# Patient Record
Sex: Male | Born: 1960 | Race: White | Hispanic: No | Marital: Single | Smoking: Current every day smoker
Health system: Southern US, Community
[De-identification: ages and names within clinical notes are randomized; demographics above are authoritative.]

## PROBLEM LIST (undated history)

## (undated) DIAGNOSIS — F209 Schizophrenia, unspecified: Secondary | ICD-10-CM

## (undated) DIAGNOSIS — I4891 Unspecified atrial fibrillation: Secondary | ICD-10-CM

## (undated) DIAGNOSIS — M5137 Other intervertebral disc degeneration, lumbosacral region: Secondary | ICD-10-CM

## (undated) DIAGNOSIS — J449 Chronic obstructive pulmonary disease, unspecified: Secondary | ICD-10-CM

## (undated) DIAGNOSIS — E669 Obesity, unspecified: Secondary | ICD-10-CM

## (undated) DIAGNOSIS — N183 Chronic kidney disease, stage 3 unspecified: Secondary | ICD-10-CM

## (undated) DIAGNOSIS — F1910 Other psychoactive substance abuse, uncomplicated: Secondary | ICD-10-CM

## (undated) DIAGNOSIS — I10 Essential (primary) hypertension: Secondary | ICD-10-CM

## (undated) DIAGNOSIS — I509 Heart failure, unspecified: Secondary | ICD-10-CM

## (undated) DIAGNOSIS — M51379 Other intervertebral disc degeneration, lumbosacral region without mention of lumbar back pain or lower extremity pain: Secondary | ICD-10-CM

## (undated) DIAGNOSIS — F418 Other specified anxiety disorders: Secondary | ICD-10-CM

## (undated) DIAGNOSIS — G894 Chronic pain syndrome: Secondary | ICD-10-CM

## (undated) DIAGNOSIS — F319 Bipolar disorder, unspecified: Secondary | ICD-10-CM

## (undated) DIAGNOSIS — G43909 Migraine, unspecified, not intractable, without status migrainosus: Secondary | ICD-10-CM

## (undated) DIAGNOSIS — K219 Gastro-esophageal reflux disease without esophagitis: Secondary | ICD-10-CM

## (undated) DIAGNOSIS — K589 Irritable bowel syndrome without diarrhea: Secondary | ICD-10-CM

## (undated) DIAGNOSIS — K861 Other chronic pancreatitis: Secondary | ICD-10-CM

## (undated) DIAGNOSIS — G4733 Obstructive sleep apnea (adult) (pediatric): Secondary | ICD-10-CM

## (undated) HISTORY — PX: PROSTATE SURGERY: SHX751

## (undated) HISTORY — DX: Obstructive sleep apnea (adult) (pediatric): G47.33

## (undated) HISTORY — PX: CHOLECYSTECTOMY: SHX55

## (undated) HISTORY — DX: Chronic obstructive pulmonary disease, unspecified: J44.9

## (undated) HISTORY — DX: Essential (primary) hypertension: I10

## (undated) HISTORY — DX: Heart failure, unspecified: I50.9

## (undated) HISTORY — DX: Unspecified atrial fibrillation: I48.91

## (undated) HISTORY — DX: Obesity, unspecified: E66.9

## (undated) HISTORY — DX: Other intervertebral disc degeneration, lumbosacral region: M51.37

## (undated) HISTORY — PX: APPENDECTOMY: SHX54

## (undated) HISTORY — DX: Chronic pain syndrome: G89.4

## (undated) HISTORY — DX: Other intervertebral disc degeneration, lumbosacral region without mention of lumbar back pain or lower extremity pain: M51.379

---

## 2012-05-01 ENCOUNTER — Encounter (HOSPITAL_BASED_OUTPATIENT_CLINIC_OR_DEPARTMENT_OTHER): Payer: Self-pay | Admitting: *Deleted

## 2012-05-01 ENCOUNTER — Emergency Department (HOSPITAL_BASED_OUTPATIENT_CLINIC_OR_DEPARTMENT_OTHER)
Admission: EM | Admit: 2012-05-01 | Discharge: 2012-05-01 | Disposition: A | Discharge status: Home / Self Care | Payer: Medicare HMO | Attending: Emergency Medicine | Admitting: Emergency Medicine

## 2012-05-01 DIAGNOSIS — F17200 Nicotine dependence, unspecified, uncomplicated: Secondary | ICD-10-CM | POA: Insufficient documentation

## 2012-05-01 DIAGNOSIS — R110 Nausea: Secondary | ICD-10-CM | POA: Insufficient documentation

## 2012-05-01 DIAGNOSIS — Z8719 Personal history of other diseases of the digestive system: Secondary | ICD-10-CM | POA: Insufficient documentation

## 2012-05-01 DIAGNOSIS — Z8659 Personal history of other mental and behavioral disorders: Secondary | ICD-10-CM | POA: Insufficient documentation

## 2012-05-01 DIAGNOSIS — G43909 Migraine, unspecified, not intractable, without status migrainosus: Secondary | ICD-10-CM | POA: Insufficient documentation

## 2012-05-01 HISTORY — DX: Schizophrenia, unspecified: F20.9

## 2012-05-01 HISTORY — DX: Bipolar disorder, unspecified: F31.9

## 2012-05-01 HISTORY — DX: Irritable bowel syndrome, unspecified: K58.9

## 2012-05-01 HISTORY — DX: Gastro-esophageal reflux disease without esophagitis: K21.9

## 2012-05-01 HISTORY — DX: Migraine, unspecified, not intractable, without status migrainosus: G43.909

## 2012-05-01 MED ORDER — HYDROMORPHONE HCL PF 1 MG/ML IJ SOLN
1.0000 mg | Freq: Once | INTRAMUSCULAR | Status: AC
  Administered 2012-05-01: 1 mg via INTRAVENOUS
  Filled 2012-05-01: qty 1

## 2012-05-01 MED ORDER — SODIUM CHLORIDE 0.9 % IV SOLN
Freq: Once | INTRAVENOUS | Status: AC
  Administered 2012-05-01: 22:00:00 via INTRAVENOUS

## 2012-05-01 MED ORDER — METOCLOPRAMIDE HCL 5 MG/ML IJ SOLN
10.0000 mg | Freq: Once | INTRAMUSCULAR | Status: AC
  Administered 2012-05-01: 10 mg via INTRAVENOUS
  Filled 2012-05-01: qty 2

## 2012-05-01 NOTE — ED Provider Notes (Signed)
History   Scribed for Celene Kras, MD, the patient was seen in room MH07/MH07 . This chart was scribed by Lewanda Rife.   CSN: [id number]  Arrival date & time 05/01/12  1642   First MD Initiated Contact with Patient 05/01/12 2127      Chief Complaint  Patient presents with  . Migraine    (Consider location/radiation/quality/duration/timing/severity/associated sxs/prior treatment) HPI  FLAVIO LINDROTH is a 52 y.o. male who presents to the Emergency Department complaining of a constant and worsening migraine onset 3 days. Pt reports migraine's progression as gradual. Pt reports nausea, photophobia and mild neck pain. Pt denies emesis, and fever. Pt states migraine is located behind both eyes. Pt states he has a hx of migraines and takes Imitrex to treat symptoms, but reports having no relief since onset.   Past Medical History  Diagnosis Date  . Migraines   . IBS (irritable bowel syndrome)   . Schizophrenia   . Bipolar affective disorder   . Acid reflux     Past Surgical History  Procedure Date  . Appendectomy   . Cholecystectomy     History reviewed. No pertinent family history.  History  Substance Use Topics  . Smoking status: Current Every Day Smoker  . Smokeless tobacco: Not on file  . Alcohol Use: No      Review of Systems  Constitutional: Negative.  Negative for fever.  HENT: Positive for neck pain. Negative for neck stiffness.   Respiratory: Negative.   Cardiovascular: Negative.   Gastrointestinal: Positive for nausea. Negative for vomiting.  Skin: Negative.   Neurological: Positive for headaches.  Hematological: Negative.   Psychiatric/Behavioral: Negative.   All other systems reviewed and are negative.    Allergies  Erythromycin; Penicillins; Darvocet; and Naprosyn  Home Medications  No current outpatient prescriptions on file.  BP 123/73  Pulse 79  Temp 97.8 F (36.6 C) (Oral)  Resp 16  Ht 5\' 8"  (1.727 m)  Wt 193 lb (87.544  kg)  BMI 29.35 kg/m2  SpO2 98%  Physical Exam  Nursing note and vitals reviewed. Constitutional: He appears well-developed and well-nourished. No distress.  HENT:  Head: Normocephalic and atraumatic.  Right Ear: External ear normal.  Left Ear: External ear normal.  Eyes: Conjunctivae normal are normal. Right eye exhibits no discharge. Left eye exhibits no discharge. No scleral icterus.  Neck: Normal range of motion and full passive range of motion without pain. Neck supple. No rigidity. No tracheal deviation present.  Cardiovascular: Normal rate, regular rhythm and intact distal pulses.   Pulmonary/Chest: Effort normal and breath sounds normal. No stridor. No respiratory distress. He has no wheezes. He has no rales.  Abdominal: Soft. Bowel sounds are normal. He exhibits no distension. There is no tenderness. There is no rebound and no guarding.  Musculoskeletal: He exhibits no edema and no tenderness.       Cervical paraspinal tenderness No signs of meningismus    Neurological: He is alert. He has normal strength. No sensory deficit. Cranial nerve deficit:  no gross defecits noted. He exhibits normal muscle tone. He displays no seizure activity. Coordination normal.  Skin: Skin is warm and dry. No rash noted.  Psychiatric: He has a normal mood and affect.    ED Course  Procedures (including critical care time) Medications administered in the ED 05/01/12 2200   0.9 % sodium chloride infusion Once    Route: Intravenous      Last MAR action: New Bag/Given Kazimierz Springborn R  05/01/12 2145   HYDROmorphone (DILAUDID) injection 1 mg Once    Route: Intravenous Ordered Dose: 1 mg      Last MAR action: Given Darlena Koval R 05/01/12 2145   metoCLOPramide (REGLAN) injection 10 mg Once    Route: Intravenous Ordered Dose: 10 mg       Labs Reviewed - No data to display No results found.   1. Migraine headache       MDM  The patient improved after treatment in emergency department. He  felt to go home and was ready for discharge  Symptoms are consistent with a migraine headache. I doubt meningitis, subarachnoid hemorrhage or other emergency medical condition     I personally performed the services described in this documentation, which was scribed in my presence. The recorded information has been reviewed and is accurate.    Celene Kras, MD 05/01/12 2218

## 2012-05-01 NOTE — ED Notes (Signed)
D/c back to [facility] with transport by [facility]- no new rx

## 2012-05-01 NOTE — ED Notes (Signed)
[facility] called for transport 

## 2012-05-01 NOTE — ED Notes (Signed)
Pt states he feels better and is ready for d/c- EDP [facility] notified- ride contacted

## 2012-05-01 NOTE — ED Notes (Signed)
Migraine since last Thursday. Neck and face hurting. PERL.

## 2012-05-01 NOTE — ED Notes (Signed)
MD at bedside. 

## 2012-05-03 ENCOUNTER — Emergency Department (HOSPITAL_BASED_OUTPATIENT_CLINIC_OR_DEPARTMENT_OTHER)
Admission: EM | Admit: 2012-05-03 | Discharge: 2012-05-03 | Disposition: A | Discharge status: Rehabilitation Facility | Payer: Medicare HMO | Attending: Emergency Medicine | Admitting: Emergency Medicine

## 2012-05-03 ENCOUNTER — Encounter (HOSPITAL_BASED_OUTPATIENT_CLINIC_OR_DEPARTMENT_OTHER): Payer: Self-pay | Admitting: Emergency Medicine

## 2012-05-03 ENCOUNTER — Emergency Department (HOSPITAL_BASED_OUTPATIENT_CLINIC_OR_DEPARTMENT_OTHER): Payer: Medicare HMO

## 2012-05-03 DIAGNOSIS — F17200 Nicotine dependence, unspecified, uncomplicated: Secondary | ICD-10-CM | POA: Insufficient documentation

## 2012-05-03 DIAGNOSIS — Z79899 Other long term (current) drug therapy: Secondary | ICD-10-CM | POA: Insufficient documentation

## 2012-05-03 DIAGNOSIS — R05 Cough: Secondary | ICD-10-CM | POA: Insufficient documentation

## 2012-05-03 DIAGNOSIS — F319 Bipolar disorder, unspecified: Secondary | ICD-10-CM | POA: Insufficient documentation

## 2012-05-03 DIAGNOSIS — R509 Fever, unspecified: Secondary | ICD-10-CM | POA: Insufficient documentation

## 2012-05-03 DIAGNOSIS — F209 Schizophrenia, unspecified: Secondary | ICD-10-CM | POA: Insufficient documentation

## 2012-05-03 DIAGNOSIS — J111 Influenza due to unidentified influenza virus with other respiratory manifestations: Secondary | ICD-10-CM

## 2012-05-03 DIAGNOSIS — Z8719 Personal history of other diseases of the digestive system: Secondary | ICD-10-CM | POA: Insufficient documentation

## 2012-05-03 DIAGNOSIS — IMO0001 Reserved for inherently not codable concepts without codable children: Secondary | ICD-10-CM | POA: Insufficient documentation

## 2012-05-03 DIAGNOSIS — K219 Gastro-esophageal reflux disease without esophagitis: Secondary | ICD-10-CM | POA: Insufficient documentation

## 2012-05-03 DIAGNOSIS — R059 Cough, unspecified: Secondary | ICD-10-CM | POA: Insufficient documentation

## 2012-05-03 DIAGNOSIS — Z8679 Personal history of other diseases of the circulatory system: Secondary | ICD-10-CM | POA: Insufficient documentation

## 2012-05-03 MED ORDER — OSELTAMIVIR PHOSPHATE 75 MG PO CAPS
75.0000 mg | ORAL_CAPSULE | Freq: Once | ORAL | Status: AC
  Administered 2012-05-03: 75 mg via ORAL
  Filled 2012-05-03: qty 1

## 2012-05-03 MED ORDER — OSELTAMIVIR PHOSPHATE 75 MG PO CAPS: 75.0000 mg | ORAL_CAPSULE | Freq: Two times a day (BID) | ORAL | Status: DC

## 2012-05-03 NOTE — ED Notes (Signed)
Pt. residing at inpt. treatment at [facility].  Several other residents have similar sx.

## 2012-05-03 NOTE — ED Notes (Signed)
Cough, sore throat, chills, fever, body aches, nausea since 05/01/12.  Seen here for same on 1/18, but states sx. worsened after he was discharged home.

## 2012-05-03 NOTE — ED Provider Notes (Signed)
History     CSN: [id number]  Arrival date & time 05/03/12  1245   First MD Initiated Contact with Patient 05/03/12 1300      Chief Complaint  Patient presents with  . Cough  . Fever    (Consider location/radiation/quality/duration/timing/severity/associated sxs/prior treatment) HPI Comments: PT comes in with cc of flu. Pt started having cough, myalgias, fevers, chills on Saturday night. The cough is occasionally productive. Pt is at [facility], where several other residents have viral syndrome or flu.  Patient is a 52 y.o. male presenting with cough and fever. The history is provided by the patient.  Cough Associated symptoms include myalgias. Pertinent negatives include no chest pain and no shortness of breath.  Fever Primary symptoms of the febrile illness include fever, cough and myalgias. Primary symptoms do not include shortness of breath, abdominal pain or dysuria.    Past Medical History  Diagnosis Date  . Migraines   . IBS (irritable bowel syndrome)   . Schizophrenia   . Bipolar affective disorder   . Acid reflux     Past Surgical History  Procedure Date  . Appendectomy   . Cholecystectomy     No family history on file.  History  Substance Use Topics  . Smoking status: Current Every Day Smoker  . Smokeless tobacco: Not on file  . Alcohol Use: No      Review of Systems  Constitutional: Positive for fever. Negative for activity change and appetite change.  Respiratory: Positive for cough. Negative for shortness of breath.   Cardiovascular: Negative for chest pain.  Gastrointestinal: Negative for abdominal pain.  Genitourinary: Negative for dysuria.  Musculoskeletal: Positive for myalgias and back pain.  Hematological: Does not bruise/bleed easily.    Allergies  Erythromycin; Penicillins; Darvocet; and Naprosyn  Home Medications   Current Outpatient Rx  Name  Route  Sig  Dispense  Refill  . MIRTAZAPINE 30 MG PO TABS   Oral   Take 30 mg  by mouth at bedtime.         . OMEPRAZOLE 40 MG PO CPDR   Oral   Take 40 mg by mouth daily.         . SUMATRIPTAN SUCCINATE 50 MG PO TABS   Oral   Take 50 mg by mouth 2 (two) times daily as needed.           BP 149/96  Pulse 79  Temp 98.4 F (36.9 C) (Oral)  Resp 20  Ht 5\' 7"  (1.702 m)  Wt 193 lb (87.544 kg)  BMI 30.23 kg/m2  SpO2 97%  Physical Exam  Nursing note and vitals reviewed. Constitutional: He is oriented to person, place, and time. He appears well-developed.  HENT:  Head: Normocephalic and atraumatic.  Eyes: Conjunctivae normal and EOM are normal. Pupils are equal, round, and reactive to light.  Neck: Normal range of motion. Neck supple.  Cardiovascular: Normal rate and regular rhythm.   Pulmonary/Chest: Effort normal and breath sounds normal. No respiratory distress. He has no wheezes. He has no rales.  Abdominal: Soft. Bowel sounds are normal. He exhibits no distension. There is no tenderness. There is no rebound and no guarding.  Neurological: He is alert and oriented to person, place, and time.  Skin: Skin is warm.    ED Course  Procedures (including critical care time)  Labs Reviewed - No data to display Dg Chest 2 View  05/03/2012  *RADIOLOGY REPORT*  Clinical Data: Chest pain.  Cough.  Fever.  CHEST - 2 VIEW  Comparison: None.  Findings: Heart size is normal.  No evidence of pulmonary infiltrate or edema.  No evidence of pleural effusion.  Mild left lower lobe scarring noted.  No mass or lymphadenopathy identified.  IMPRESSION: No active disease.   Original Report Authenticated By: Myles Rosenthal, M.D.      No diagnosis found.    MDM  DDX includes: Viral syndrome Influenza   Pt comes in with cc of URI like sx. He has myalgias, chills, cough. Vitals show slight tachypnea - RR 18-20 in the ED, otherwise no tachycardia, no fevers. He has some co morbidities, notably CKD - but there is no dehydration noted. No indication for admission. Will  give tamiflu as he is within the window and living at a facility where there are other people who can get infected from him.  Derwood Kaplan, MD 05/03/12 1527

## 2012-05-12 ENCOUNTER — Encounter (HOSPITAL_BASED_OUTPATIENT_CLINIC_OR_DEPARTMENT_OTHER): Payer: Self-pay | Admitting: *Deleted

## 2012-05-12 ENCOUNTER — Emergency Department (HOSPITAL_BASED_OUTPATIENT_CLINIC_OR_DEPARTMENT_OTHER)
Admission: EM | Admit: 2012-05-12 | Discharge: 2012-05-12 | Disposition: A | Discharge status: Home / Self Care | Payer: Medicare HMO | Attending: Emergency Medicine | Admitting: Emergency Medicine

## 2012-05-12 ENCOUNTER — Emergency Department (HOSPITAL_BASED_OUTPATIENT_CLINIC_OR_DEPARTMENT_OTHER): Payer: Medicare HMO

## 2012-05-12 DIAGNOSIS — R1084 Generalized abdominal pain: Secondary | ICD-10-CM | POA: Insufficient documentation

## 2012-05-12 DIAGNOSIS — R109 Unspecified abdominal pain: Secondary | ICD-10-CM

## 2012-05-12 DIAGNOSIS — N183 Chronic kidney disease, stage 3 unspecified: Secondary | ICD-10-CM | POA: Insufficient documentation

## 2012-05-12 DIAGNOSIS — Z8719 Personal history of other diseases of the digestive system: Secondary | ICD-10-CM | POA: Insufficient documentation

## 2012-05-12 DIAGNOSIS — F17200 Nicotine dependence, unspecified, uncomplicated: Secondary | ICD-10-CM | POA: Insufficient documentation

## 2012-05-12 DIAGNOSIS — F341 Dysthymic disorder: Secondary | ICD-10-CM | POA: Insufficient documentation

## 2012-05-12 DIAGNOSIS — Z8679 Personal history of other diseases of the circulatory system: Secondary | ICD-10-CM | POA: Insufficient documentation

## 2012-05-12 DIAGNOSIS — F319 Bipolar disorder, unspecified: Secondary | ICD-10-CM | POA: Insufficient documentation

## 2012-05-12 DIAGNOSIS — F209 Schizophrenia, unspecified: Secondary | ICD-10-CM | POA: Insufficient documentation

## 2012-05-12 DIAGNOSIS — F1410 Cocaine abuse, uncomplicated: Secondary | ICD-10-CM | POA: Insufficient documentation

## 2012-05-12 DIAGNOSIS — Z79899 Other long term (current) drug therapy: Secondary | ICD-10-CM | POA: Insufficient documentation

## 2012-05-12 HISTORY — DX: Other chronic pancreatitis: K86.1

## 2012-05-12 HISTORY — DX: Chronic kidney disease, stage 3 unspecified: N18.30

## 2012-05-12 HISTORY — DX: Other specified anxiety disorders: F41.8

## 2012-05-12 HISTORY — DX: Chronic kidney disease, stage 3 (moderate): N18.3

## 2012-05-12 HISTORY — DX: Other psychoactive substance abuse, uncomplicated: F19.10

## 2012-05-12 LAB — COMPREHENSIVE METABOLIC PANEL
AST: 13 U/L (ref 0–37)
Albumin: 3.7 g/dL (ref 3.5–5.2)
Alkaline Phosphatase: 66 U/L (ref 39–117)
Chloride: 106 mEq/L (ref 96–112)
Creatinine, Ser: 1.1 mg/dL (ref 0.50–1.35)
Potassium: 4.6 mEq/L (ref 3.5–5.1)
Total Bilirubin: 0.3 mg/dL (ref 0.3–1.2)
Total Protein: 6.5 g/dL (ref 6.0–8.3)

## 2012-05-12 LAB — URINALYSIS, ROUTINE W REFLEX MICROSCOPIC
Ketones, ur: NEGATIVE mg/dL
Leukocytes, UA: NEGATIVE
Nitrite: NEGATIVE
Protein, ur: NEGATIVE mg/dL
Urobilinogen, UA: 0.2 mg/dL (ref 0.0–1.0)

## 2012-05-12 LAB — CBC WITH DIFFERENTIAL/PLATELET
Basophils Absolute: 0 10*3/uL (ref 0.0–0.1)
Basophils Relative: 0 % (ref 0–1)
Eosinophils Absolute: 0.2 10*3/uL (ref 0.0–0.7)
MCHC: 35 g/dL (ref 30.0–36.0)
Neutro Abs: 3.5 10*3/uL (ref 1.7–7.7)
Neutrophils Relative %: 51 % (ref 43–77)
RDW: 12.8 % (ref 11.5–15.5)

## 2012-05-12 MED ORDER — SODIUM CHLORIDE 0.9 % IV BOLUS (SEPSIS)
1000.0000 mL | Freq: Once | INTRAVENOUS | Status: AC
  Administered 2012-05-12: 1000 mL via INTRAVENOUS

## 2012-05-12 MED ORDER — ONDANSETRON HCL 4 MG/2ML IJ SOLN
4.0000 mg | Freq: Once | INTRAMUSCULAR | Status: AC
  Administered 2012-05-12: 4 mg via INTRAVENOUS
  Filled 2012-05-12: qty 2

## 2012-05-12 NOTE — ED Provider Notes (Signed)
History     CSN: [id number]  Arrival date & time 05/12/12  0940   First MD Initiated Contact with Patient 05/12/12 [phone]      Chief Complaint  Patient presents with  . Abdominal Pain    (Consider location/radiation/quality/duration/timing/severity/associated sxs/prior treatment) HPI Comments: Patient here from [facility] where he is receiving treatment for cocaine addiction.  Presents with generalized abd pain, distension that has worsened over the past week.  He has a history of similar episodes for which he reports multiple colonoscpopy have been performed.  No cause has been found.  He does have a history of pancreatitis and has had his gallbladder and appendix removed in the past.  No fevers or chills.  No urinary complaints.   Patient is a 52 y.o. male presenting with abdominal pain. The history is provided by the patient.  Abdominal Pain The primary symptoms of the illness include abdominal pain and nausea. The primary symptoms of the illness do not include fever, vomiting, diarrhea or dysuria. Episode onset: one week ago. The onset of the illness was gradual. The problem has been gradually worsening.  The patient has not had a change in bowel habit. Symptoms associated with the illness do not include chills, constipation, hematuria, frequency or back pain.    Past Medical History  Diagnosis Date  . Migraines   . IBS (irritable bowel syndrome)   . Schizophrenia   . Bipolar affective disorder   . Acid reflux   . Substance abuse   . Chronic kidney disease (CKD), stage III (moderate)   . Depression with anxiety   . Pancreatitis, chronic     Past Surgical History  Procedure Date  . Appendectomy   . Cholecystectomy     No family history on file.  History  Substance Use Topics  . Smoking status: Current Every Day Smoker  . Smokeless tobacco: Not on file  . Alcohol Use: No      Review of Systems  Constitutional: Negative for fever and chills.  Gastrointestinal:  Positive for nausea and abdominal pain. Negative for vomiting, diarrhea and constipation.  Genitourinary: Negative for dysuria, frequency and hematuria.  Musculoskeletal: Negative for back pain.  All other systems reviewed and are negative.    Allergies  Erythromycin; Penicillins; Darvocet; and Naprosyn  Home Medications   Current Outpatient Rx  Name  Route  Sig  Dispense  Refill  . HYDROCORTISONE 2.5 % RE CREA   Rectal   Place rectally 2 (two) times daily.         Marland Kitchen PERPHENAZINE 4 MG PO TABS   Oral   Take 4 mg by mouth 2 (two) times daily.         Marland Kitchen MIRTAZAPINE 30 MG PO TABS   Oral   Take 30 mg by mouth at bedtime.         . OMEPRAZOLE 40 MG PO CPDR   Oral   Take 40 mg by mouth daily.         . OSELTAMIVIR PHOSPHATE 75 MG PO CAPS   Oral   Take 1 capsule (75 mg total) by mouth every 12 (twelve) hours.   13 capsule   0   . SUMATRIPTAN SUCCINATE 50 MG PO TABS   Oral   Take 50 mg by mouth 2 (two) times daily as needed.           BP 128/79  Pulse 75  Temp 98.1 F (36.7 C) (Oral)  Resp 18  SpO2 100%  Physical  Exam  Nursing note and vitals reviewed. Constitutional: He is oriented to person, place, and time. He appears well-developed and well-nourished. No distress.  HENT:  Head: Normocephalic and atraumatic.  Mouth/Throat: Oropharynx is clear and moist.  Neck: Normal range of motion. Neck supple.  Cardiovascular: Normal rate and regular rhythm.   Pulmonary/Chest: Effort normal and breath sounds normal.  Abdominal: Soft. Bowel sounds are normal. He exhibits no distension.       There is ttp across the mid abdomen with no rebound or guarding.    Musculoskeletal: Normal range of motion. He exhibits no edema.  Neurological: He is alert and oriented to person, place, and time.  Skin: Skin is warm and dry. He is not diaphoretic.    ED Course  Procedures (including critical care time)   Labs Reviewed  URINALYSIS, ROUTINE W REFLEX MICROSCOPIC  CBC  WITH DIFFERENTIAL  COMPREHENSIVE METABOLIC PANEL  LIPASE, BLOOD   No results found.   No diagnosis found.    MDM  The patient presents with abd pain and distention, but I cannot find a reason for this.  The labs and ct are reassuring.  He has a long history of similar episodes and he needs to follow up with the GI doctor who was treated him in the past.  As he is at [facility], he cannot receive pain medications.  He is to take tylenol or motrin.        Geoffery Lyons, MD 05/12/12 [phone]

## 2012-05-12 NOTE — ED Notes (Signed)
Patient states he has generalized abdominal pain for one week.  States he c/o tightness in his mid abdomen and the pain has worsened.  Denies nausea, vomiting or diarrhea.  States he is having normal bowel movements.

## 2018-07-16 ENCOUNTER — Other Ambulatory Visit: Payer: Self-pay

## 2018-07-16 ENCOUNTER — Emergency Department (HOSPITAL_COMMUNITY)
Admission: EM | Admit: 2018-07-16 | Discharge: 2018-07-19 | Disposition: A | Discharge status: Home / Self Care | Payer: Medicare Other | Attending: Emergency Medicine | Admitting: Emergency Medicine

## 2018-07-16 ENCOUNTER — Emergency Department (HOSPITAL_COMMUNITY): Payer: Medicare Other

## 2018-07-16 DIAGNOSIS — F17200 Nicotine dependence, unspecified, uncomplicated: Secondary | ICD-10-CM | POA: Diagnosis not present

## 2018-07-16 DIAGNOSIS — R4689 Other symptoms and signs involving appearance and behavior: Secondary | ICD-10-CM

## 2018-07-16 DIAGNOSIS — R45851 Suicidal ideations: Secondary | ICD-10-CM | POA: Insufficient documentation

## 2018-07-16 DIAGNOSIS — R197 Diarrhea, unspecified: Secondary | ICD-10-CM | POA: Insufficient documentation

## 2018-07-16 DIAGNOSIS — R4589 Other symptoms and signs involving emotional state: Secondary | ICD-10-CM

## 2018-07-16 DIAGNOSIS — R109 Unspecified abdominal pain: Secondary | ICD-10-CM | POA: Diagnosis not present

## 2018-07-16 DIAGNOSIS — R443 Hallucinations, unspecified: Secondary | ICD-10-CM

## 2018-07-16 DIAGNOSIS — F250 Schizoaffective disorder, bipolar type: Secondary | ICD-10-CM | POA: Diagnosis not present

## 2018-07-16 DIAGNOSIS — R0789 Other chest pain: Secondary | ICD-10-CM | POA: Insufficient documentation

## 2018-07-16 DIAGNOSIS — R079 Chest pain, unspecified: Secondary | ICD-10-CM

## 2018-07-16 DIAGNOSIS — Z79899 Other long term (current) drug therapy: Secondary | ICD-10-CM | POA: Insufficient documentation

## 2018-07-16 DIAGNOSIS — R45850 Homicidal ideations: Secondary | ICD-10-CM

## 2018-07-16 DIAGNOSIS — N183 Chronic kidney disease, stage 3 (moderate): Secondary | ICD-10-CM | POA: Diagnosis not present

## 2018-07-16 LAB — COMPREHENSIVE METABOLIC PANEL
ALT: 19 U/L (ref 0–44)
AST: 16 U/L (ref 15–41)
Albumin: 3.3 g/dL — ABNORMAL LOW (ref 3.5–5.0)
Alkaline Phosphatase: 56 U/L (ref 38–126)
Anion gap: 8 (ref 5–15)
BUN: 25 mg/dL — ABNORMAL HIGH (ref 6–20)
CO2: 27 mmol/L (ref 22–32)
Calcium: 8.8 mg/dL — ABNORMAL LOW (ref 8.9–10.3)
Chloride: 103 mmol/L (ref 98–111)
Creatinine, Ser: 1.36 mg/dL — ABNORMAL HIGH (ref 0.61–1.24)
GFR calc Af Amer: >60 mL/min (ref >60)
GFR calc non Af Amer: 58 mL/min — ABNORMAL LOW (ref >60)
Glucose, Bld: 141 mg/dL — ABNORMAL HIGH (ref 70–99)
Potassium: 4.1 mmol/L (ref 3.5–5.1)
Sodium: 138 mmol/L (ref 135–145)
Total Bilirubin: 0.4 mg/dL (ref 0.3–1.2)
Total Protein: 5.8 g/dL — ABNORMAL LOW (ref 6.5–8.1)

## 2018-07-16 LAB — URINALYSIS, ROUTINE W REFLEX MICROSCOPIC
Bilirubin Urine: NEGATIVE
Glucose, UA: NEGATIVE mg/dL
Hgb urine dipstick: NEGATIVE
Ketones, ur: NEGATIVE mg/dL
Nitrite: NEGATIVE
Protein, ur: 30 mg/dL — AB
Specific Gravity, Urine: 1.02 (ref 1.005–1.030)
WBC, UA: >50 WBC/hpf — ABNORMAL HIGH (ref 0–5)
pH: 5 (ref 5.0–8.0)

## 2018-07-16 LAB — CBC WITH DIFFERENTIAL/PLATELET
Abs Immature Granulocytes: 0.02 10*3/uL (ref 0.00–0.07)
Basophils Absolute: 0 10*3/uL (ref 0.0–0.1)
Basophils Relative: 0 %
Eosinophils Absolute: 0.3 10*3/uL (ref 0.0–0.5)
Eosinophils Relative: 5 %
HCT: 44.4 % (ref 39.0–52.0)
Hemoglobin: 15.4 g/dL (ref 13.0–17.0)
Immature Granulocytes: 0 %
Lymphocytes Relative: 37 %
Lymphs Abs: 2.6 10*3/uL (ref 0.7–4.0)
MCH: 32 pg (ref 26.0–34.0)
MCHC: 34.7 g/dL (ref 30.0–36.0)
MCV: 92.1 fL (ref 80.0–100.0)
Monocytes Absolute: 0.6 10*3/uL (ref 0.1–1.0)
Monocytes Relative: 8 %
Neutro Abs: 3.5 10*3/uL (ref 1.7–7.7)
Neutrophils Relative %: 50 %
Platelets: 208 10*3/uL (ref 150–400)
RBC: 4.82 MIL/uL (ref 4.22–5.81)
RDW: 13.6 % (ref 11.5–15.5)
WBC: 7.1 10*3/uL (ref 4.0–10.5)
nRBC: 0 % (ref 0.0–0.2)

## 2018-07-16 LAB — LIPASE, BLOOD: Lipase: 22 U/L (ref 11–51)

## 2018-07-16 LAB — RAPID URINE DRUG SCREEN, HOSP PERFORMED
Amphetamines: NOT DETECTED
Barbiturates: NOT DETECTED
Benzodiazepines: NOT DETECTED
Cocaine: POSITIVE — AB
Opiates: NOT DETECTED
Tetrahydrocannabinol: NOT DETECTED

## 2018-07-16 LAB — TROPONIN I
Troponin I: <0.03 ng/mL (ref <0.03)
Troponin I: <0.03 ng/mL (ref <0.03)

## 2018-07-16 MED ORDER — GENERIC EXTERNAL MEDICATION
Status: DC
Start: ? — End: 2018-07-16

## 2018-07-16 MED ORDER — BUPROPION HCL ER (SR) 150 MG PO TB12
150.00 | ORAL_TABLET | ORAL | Status: DC
Start: 2018-07-15 — End: 2018-07-16

## 2018-07-16 MED ORDER — ALBUTEROL SULFATE HFA 108 (90 BASE) MCG/ACT IN AERS
1.0000 | INHALATION_SPRAY | Freq: Once | RESPIRATORY_TRACT | Status: AC
  Administered 2018-07-16: 2 via RESPIRATORY_TRACT
  Filled 2018-07-16: qty 6.7

## 2018-07-16 MED ORDER — HYDROXYZINE HCL 25 MG PO TABS
50.00 | ORAL_TABLET | ORAL | Status: DC
Start: ? — End: 2018-07-16

## 2018-07-16 MED ORDER — BENZOCAINE-MENTHOL 6-10 MG MT LOZG
1.00 | LOZENGE | OROMUCOSAL | Status: DC
Start: ? — End: 2018-07-16

## 2018-07-16 MED ORDER — TRAZODONE HCL 50 MG PO TABS
50.0000 mg | ORAL_TABLET | Freq: Every day | ORAL | Status: DC
  Administered 2018-07-17 – 2018-07-18 (×3): 50 mg via ORAL
  Filled 2018-07-16 (×3): qty 1

## 2018-07-16 MED ORDER — BUPROPION HCL ER (SR) 150 MG PO TB12
150.0000 mg | ORAL_TABLET | Freq: Every day | ORAL | Status: DC
  Administered 2018-07-17 – 2018-07-19 (×3): 150 mg via ORAL
  Filled 2018-07-16 (×3): qty 1

## 2018-07-16 MED ORDER — IBUPROFEN 400 MG PO TABS
800.00 | ORAL_TABLET | ORAL | Status: DC
Start: ? — End: 2018-07-16

## 2018-07-16 MED ORDER — ALBUTEROL SULFATE (2.5 MG/3ML) 0.083% IN NEBU
2.50 | INHALATION_SOLUTION | RESPIRATORY_TRACT | Status: DC
Start: ? — End: 2018-07-16

## 2018-07-16 MED ORDER — PRIMIDONE 50 MG PO TABS
50.0000 mg | ORAL_TABLET | Freq: Every day | ORAL | Status: DC
  Administered 2018-07-17 – 2018-07-18 (×3): 50 mg via ORAL
  Filled 2018-07-16 (×4): qty 1

## 2018-07-16 MED ORDER — OLANZAPINE 5 MG PO TBDP
10.0000 mg | ORAL_TABLET | Freq: Every day | ORAL | Status: DC
  Administered 2018-07-17 – 2018-07-18 (×3): 10 mg via ORAL
  Filled 2018-07-16 (×3): qty 2

## 2018-07-16 MED ORDER — GENERIC EXTERNAL MEDICATION
5.00 | Status: DC
Start: ? — End: 2018-07-16

## 2018-07-16 MED ORDER — GABAPENTIN 600 MG PO TABS
600.0000 mg | ORAL_TABLET | Freq: Four times a day (QID) | ORAL | Status: DC
  Administered 2018-07-17 – 2018-07-19 (×6): 600 mg via ORAL
  Filled 2018-07-16 (×6): qty 1

## 2018-07-16 MED ORDER — BISACODYL 5 MG PO TBEC
10.00 | DELAYED_RELEASE_TABLET | ORAL | Status: DC
Start: ? — End: 2018-07-16

## 2018-07-16 MED ORDER — ENOXAPARIN SODIUM 40 MG/0.4ML ~~LOC~~ SOLN
40.00 | SUBCUTANEOUS | Status: DC
Start: 2018-07-14 — End: 2018-07-16

## 2018-07-16 MED ORDER — TRAZODONE HCL 50 MG PO TABS
50.00 | ORAL_TABLET | ORAL | Status: DC
Start: 2018-07-14 — End: 2018-07-16

## 2018-07-16 MED ORDER — UMECLIDINIUM BROMIDE 62.5 MCG/INH IN AEPB
1.00 | INHALATION_SPRAY | RESPIRATORY_TRACT | Status: DC
Start: 2018-07-15 — End: 2018-07-16

## 2018-07-16 MED ORDER — CARIPRAZINE HCL 1.5 MG PO CAPS
4.5000 mg | ORAL_CAPSULE | Freq: Every day | ORAL | Status: DC
  Administered 2018-07-17 – 2018-07-18 (×3): 4.5 mg via ORAL
  Filled 2018-07-16 (×4): qty 3

## 2018-07-16 MED ORDER — PRIMIDONE 50 MG PO TABS
50.00 | ORAL_TABLET | ORAL | Status: DC
Start: 2018-07-14 — End: 2018-07-16

## 2018-07-16 MED ORDER — GUAIFENESIN 100 MG/5ML PO SYRP
200.00 | ORAL_SOLUTION | ORAL | Status: DC
Start: ? — End: 2018-07-16

## 2018-07-16 MED ORDER — SODIUM CHLORIDE 0.9 % IV BOLUS
1000.0000 mL | Freq: Once | INTRAVENOUS | Status: AC
  Administered 2018-07-16: 1000 mL via INTRAVENOUS

## 2018-07-16 MED ORDER — LEVOFLOXACIN 500 MG PO TABS
500.00 | ORAL_TABLET | ORAL | Status: DC
Start: 2018-07-14 — End: 2018-07-16

## 2018-07-16 MED ORDER — HALOPERIDOL 5 MG PO TABS
5.00 | ORAL_TABLET | ORAL | Status: DC
Start: ? — End: 2018-07-16

## 2018-07-16 MED ORDER — FOSFOMYCIN TROMETHAMINE 3 G PO PACK
3.0000 g | PACK | Freq: Once | ORAL | Status: AC
  Administered 2018-07-16: 3 g via ORAL
  Filled 2018-07-16: qty 3

## 2018-07-16 MED ORDER — OLANZAPINE 10 MG PO TBDP
10.00 | ORAL_TABLET | ORAL | Status: DC
Start: 2018-07-14 — End: 2018-07-16

## 2018-07-16 MED ORDER — ONDANSETRON 4 MG PO TBDP
4.00 | ORAL_TABLET | ORAL | Status: DC
Start: ? — End: 2018-07-16

## 2018-07-16 MED ORDER — PANTOPRAZOLE SODIUM 40 MG PO TBEC
40.0000 mg | DELAYED_RELEASE_TABLET | Freq: Every day | ORAL | Status: DC
  Administered 2018-07-17 – 2018-07-19 (×3): 40 mg via ORAL
  Filled 2018-07-16 (×4): qty 1

## 2018-07-16 MED ORDER — LORAZEPAM 2 MG/ML IJ SOLN
2.00 | INTRAMUSCULAR | Status: DC
Start: ? — End: 2018-07-16
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
## 2018-07-16 NOTE — ED Notes (Signed)
Belongings placed in LOCKER # 1. Wallet and valuables given to security and placed in safe.

## 2018-07-16 NOTE — ED Notes (Signed)
Pt talking to tts 

## 2018-07-16 NOTE — ED Notes (Signed)
Per phone call from [facility], pt is being admitted, currently looking for bed. Provider made aware.

## 2018-07-16 NOTE — ED Notes (Signed)
ED Provider at bedside. 

## 2018-07-16 NOTE — ED Notes (Signed)
Sitter at bedside.

## 2018-07-16 NOTE — ED Triage Notes (Addendum)
Pt here c/o suicidal/ homicidal thoughts ; pt denies any specific suicide plan but patient does state that hes constantly hearing voices that are " telling me to hurt people "   Pt alert and oriented x 4 and cooperative ; pt also c.o left sided chest pain radiating to the left arm
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
## 2018-07-16 NOTE — Progress Notes (Signed)
Pt meets inpatient criteria per Nira Conn, NP. Referral information has been sent to the following hospitals for review:  [facility]  [facility]  [facility]  [facility]  [facility]  [facility]  [facility]   Disposition will continue to assist with inpatient placement needs.   Wells Guiles, LCSW, LCAS Disposition CSW [facility] BHH/TTS [phone]

## 2018-07-16 NOTE — ED Notes (Signed)
Meal given at this time

## 2018-07-16 NOTE — ED Notes (Addendum)
Patient wanded by security  At this time  And placed in purple scrubs

## 2018-07-17 NOTE — ED Notes (Addendum)
Pt woke - ate breakfast - showered - then returned to sleeping. Woke pt so may take meds and for re-TTS. Pt states "That medicine y'all gave me knocked me out. No, I haven't woke up just yet" when asked if he was having thoughts of SI/HI.

## 2018-07-17 NOTE — BHH Counselor (Signed)
TTS reassessment: Patient is sitting up in bed and drowsy. He is oriented x 4. Patient's mood is pleasant and affect is congruent. He is apologetic that he keeps falling asleep due to the medications he was given. Patient continues to endorse AH/SI/HI. Patient informed we were currently seeking placement. He demonstrated understanding. In patient continues to be recommended.

## 2018-07-17 NOTE — ED Notes (Signed)
Pt found to be 88% on RA while sleeping, pt endorses hx of sleep apnea. RN placed pt on 3-4L [place] during rest due to hx of sleep apnea. Pt now 96%, will continue to monitor.

## 2018-07-17 NOTE — ED Provider Notes (Signed)
Emergency Medicine Observation Re-evaluation Note  Jermaine Gutierrez is a 58 y.o. male, seen on rounds today.  Pt initially presented to the ED for complaints of Suicidal and Chest Pain Currently, the patient is resting comfortably, no acute events overnight.  Patient continues to endorse some suicidal ideations, he reports this is somewhat better while he is here in the hospital compared to when he was out walking around.  He denies any acute pain or complaints this morning.  Physical Exam  BP 102/63 (BP Location: Left Arm)   Pulse 72   Temp 97.9 F (36.6 C) (Oral)   Resp 18   SpO2 92%  Physical Exam Vitals signs and nursing note reviewed.  Constitutional:      General: He is not in acute distress.    Appearance: He is well-developed and normal weight. He is not diaphoretic.  HENT:     Head: Normocephalic and atraumatic.  Eyes:     General:        Right eye: No discharge.        Left eye: No discharge.  Cardiovascular:     Rate and Rhythm: Normal rate and regular rhythm.     Heart sounds: Normal heart sounds. No murmur. No friction rub. No gallop.   Pulmonary:     Effort: Pulmonary effort is normal. No respiratory distress.     Breath sounds: Normal breath sounds.     Comments: Respirations equal and unlabored, patient able to speak in full sentences, lungs clear to auscultation bilaterally Abdominal:     General: Bowel sounds are normal.     Palpations: Abdomen is soft. There is no mass.     Tenderness: There is no guarding.  Skin:    General: Skin is warm and dry.  Neurological:     Mental Status: He is alert and oriented to person, place, and time.     Coordination: Coordination normal.  Psychiatric:        Behavior: Behavior normal.        Thought Content: Thought content includes suicidal ideation.     ED Course / MDM  EKG:EKG Interpretation  Date/Time:  Friday July 16 2018 14:37:00 EDT Ventricular Rate:  93 PR Interval:    QRS Duration: 83 QT  Interval:  357 QTC Calculation: 444 R Axis:   87 Text Interpretation:  Sinus rhythm Borderline short PR interval No old tracing to compare Confirmed by Melene Plan [phone]) on 07/16/2018 2:58:38 PM    I have reviewed the labs performed to date as well as medications administered while in observation.  No recent changes in the last 24 hours, vitals remained stable.  Patient receiving home medications. Plan  Current plan is for inpatient psych placement, TTS and to reevaluate patient this morning.  No acute medical needs this morning. Patient is not under full IVC at this time.   Dartha Lodge, PA-C 07/17/18 1423    Maia Plan, MD 07/17/18 [phone]

## 2018-07-17 NOTE — ED Notes (Signed)
Lunch tray ordered 

## 2018-07-17 NOTE — ED Notes (Signed)
Pt states he is not having any SI thoughts, but still having homicidal thoughts, along with visual and auditory hallucinations. Environment secure and sitter at bedside. Pt calm and cooperative, watching tv in bed.

## 2018-07-17 NOTE — ED Notes (Signed)
ORDERED BFAST TRAY  

## 2018-07-17 NOTE — ED Notes (Signed)
Dinner tray ordered - house tray 

## 2018-07-17 NOTE — ED Notes (Signed)
Pt calm and cooperative. Given nightly snack. Sitter at bedside

## 2018-07-17 NOTE — Progress Notes (Addendum)
CSW contacted referral facilities with the following results:   Still reviewing: [facility] (still trying to verify insurance and should call back later) [facility] ( [facility] (resent per request) [facility] (gave a different number to fax information to [phone] which was done)  Declined: [facility] (no bed availability) [facility] (did not meet criteria)  TTS will continue to seek placement.  Vilma Meckel. Algis Greenhouse, MSW, LCSW Clinical Social Work/Disposition Phone: [phone] Fax: [phone]

## 2018-07-18 LAB — URINE CULTURE: Culture: NO GROWTH

## 2018-07-18 NOTE — ED Notes (Signed)
Ordered breakfast 

## 2018-07-18 NOTE — Care Management (Signed)
Patient awaiting BH placement.
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
## 2018-07-18 NOTE — ED Provider Notes (Signed)
Emergency Medicine Observation Re-evaluation Note  Jermaine Gutierrez is a 58 y.o. male, seen on rounds today.  Pt initially presented to the ED for complaints of Suicidal and Chest Pain Currently, the patient is resting comfortably without complaint. Denies chest pain. No urinary symptoms.  Physical Exam  BP 120/74 (BP Location: Left Arm)   Pulse 69   Temp 97.6 F (36.4 C) (Oral)   Resp 20   SpO2 95%  Physical Exam Vitals signs and nursing note reviewed.  Constitutional:      General: He is not in acute distress.    Appearance: He is well-developed.  HENT:     Head: Normocephalic and atraumatic.  Neck:     Musculoskeletal: Neck supple.  Cardiovascular:     Heart sounds: Normal heart sounds. No murmur.  Abdominal:     General: There is no distension.     Palpations: Abdomen is soft.     Tenderness: There is no abdominal tenderness.  Skin:    General: Skin is warm and dry.  Neurological:     Mental Status: He is alert.     ED Course / MDM  EKG:EKG Interpretation  Date/Time:  Friday July 16 2018 14:37:00 EDT Ventricular Rate:  93 PR Interval:    QRS Duration: 83 QT Interval:  357 QTC Calculation: 444 R Axis:   87 Text Interpretation:  Sinus rhythm Borderline short PR interval No old tracing to compare Confirmed by Melene Plan [phone]) on 07/16/2018 2:58:38 PM    I have reviewed the labs performed to date as well as medications administered while in observation.  No recent changes in the last 24 hours. VSS.    Plan  Current plan is for inpatient ppsych placement. Psych to see this am. No acute medical needs this morning. Patient is not under full IVC at this time.   Maninder Deboer, Chase Picket, PA-C 07/18/18 5732    Shaune Pollack, MD 07/18/18 [phone]

## 2018-07-18 NOTE — ED Notes (Signed)
Lunch tray ordered 

## 2018-07-18 NOTE — ED Notes (Signed)
Re-TTS completed.  

## 2018-07-18 NOTE — ED Notes (Signed)
Dinner tray ordered.

## 2018-07-18 NOTE — ED Notes (Signed)
Pt is back in his room, snack has been given to pt.

## 2018-07-18 NOTE — ED Notes (Signed)
Pt is taking shower at this time. Supplies have been given. (burgendy scrubs, socks, deodorant, shampoo, toothpaste, tooth brush, and body wash.)

## 2018-07-18 NOTE — ED Notes (Signed)
Pt woke - ambulated to bathroom - then returned to bed. Denies SI this am - states "I'm homicidal toward people". Would not elaborate further.

## 2018-07-19 NOTE — ED Provider Notes (Signed)
Blood pressure 123/89, pulse 74, temperature 98 F (36.7 C), temperature source Oral, resp. rate 20, SpO2 95 %.  In short, Jermaine Gutierrez is a 58 y.o. male with a chief complaint of Suicidal and Chest Pain .  Refer to the original H&P for additional details.  Patient evaluated overall health.  They are clearing the patient for discharge.  Initially presented with suicidal and homicidal thoughts.  No longer endorsing this.  Patient has outpatient follow-up information at discharge.     Maia Plan, MD 07/19/18 1306

## 2018-07-19 NOTE — Discharge Instructions (Signed)
Substance Abuse Treatment Programs ° °Intensive Outpatient Programs °[facility]     °[address]      °[place], [place]                   °[phone]      ° °[facility] °[address] °[place], [place] °336-379-7146 ° °[facility]     °(Inpatient and outpatient)     °[address]           °[phone]   ° °[facility] °336-288-1484 (Suboxone and Methadone) ° °[address]      °[place], [place] [address]      °336-882-2125      ° °[address] °[place], [place] °852-3033 ° °[facility] (Outpatient/Inpatient, Chemical)    °(insurance only) [phone]      °       °[facility] (Groups & Residential) °[place], [place] °336-389-1413 ° °   °[facility]     °[address]     °[place], [place]      °[phone]      ° °[facility] (for caregivers and family) °[address] °[place], [place] °336-299-4655 ° ° ° ° ° °Residential Treatment Programs °[facility]      °[address], [place], [place] [address]  °([phone]      ° °[facility] °[address] [place], [place] [address] °[phone] ° °[facility]        °336-248-8914      ° °[facility] °1-800-659-3381 ° °[facility]             °[address]                                         °[place], [place]                                                °877-615-2722 or 336-784-9470                              ° °[facility] [place] °[address] °[place], [address] °1.877.941.8954 ° °[facility]    °[address]      °[place], [place]     °[phone]      ° °[facility] °[address] °[place], [place] °336-285-9073 ° °[facility]   °[address]     °[place], [place] [address]     °336-899-1550      °Admissions: 8am-3pm M-F ° °[facility] °[address] °[place],  [place] °336-227-7417 ° °[facility]: Residential Program (90 Days)   °[place], [place]      °336-725-8389 or 800-758-6077    ° °[facility] °[place], [place] °(Walk in Hours over the weekend or by referral) ° °[facility] °[address], [place], [place] [address] °(336) [phone] ° °Crisis Mobile: [facility]:  1-877-626-1772 (for crisis response 24 hours a day) °[facility] Hotline:      1-800-256-2452 °Outpatient Psychiatry and Counseling ° °[facility]: Mobile Crisis   Management 24 hours:  1-877-626-1772 ° °[facility] sliding scale fee and walk in schedule: M-F 8am-12pm/1pm-3pm °[address]  °[place], [place] [address] °336-387-6161 ° °[facility] °[address] °[place], [place] [address] °336-703-9650 ° °[facility] ([facility] known [facility])- new patient walk-in appointments available Monday - Friday 8am -3pm.          °[address] °[place], [place] [address] °336-676-6840 or crisis line- 336-676-6905 ° °[facility]/ Intensive Outpatient Therapy Program °[address] °[place], [place] [address] °336-832-9804 ° °[facility]                  °Crisis Services      °336.641.4993      °[address]     °[place], [place] [address]                ° °[facility]   °[facility] °[phone] °[address] °[place], [place] [address] ° ° °[facility]          °[address],  °[place], [place] [address]       °(336) [phone] ° °[facility] °[address] °[place], [place] [address] °336-292-1510 ° °[facility]    °[address]    °[place], [place] [address]     °336-632-3505      ° °Parish McKinney, [place]     °[address]     °[place] [address]     °336-282-1251     °  °[facility] °[address] °[place] [address] ° °[facility]     °[address]     °[place], [place]      °336-542-2076      ° °[facility] °Shamsher Ahluwalia, MD °[address] °[place], [place] [address] °336-420-9558 ° °[facility]     °[address]     °[place], [place] [address]     °336-274-1237      ° °[facility] °[address] °[place], [place] [address] °336-854-4450 °Resources for Temporary Residential Assistance/Crisis Centers ° °DAY CENTERS °[facility] °M-F 8am-3pm   °[address] [place], [place] [address]   336-332-0824 °Services include: laundry, barbering, support groups, case management, phone  & computer access, showers, AA/NA mtgs, mental health/substance abuse nurse, job skills class, disability information, VA assistance, spiritual classes, etc.  ° °HOMELESS SHELTERS ° °[facility]     °[facility]   °[address], [place]     °336.271.5959       °       °[facility] (women and children)       °[address] °[place], [place] [address] °336-275-0820 °[email or web address] for application and process °Application Required ° °[facility]   °[address]    °[place] [address]     °336.886.4922       °             °[facility] °[address] °[place], [place] [address] °336.273.5572 °336-235-0363(schedule application appt.) °Application Required ° °[facility] (women only)    °[address]     °[place],  [address]     °336-884-1039      °  Intake starts 6pm daily °Need valid ID, SSC, & Police report °[facility] [place] °[address] °[place], [place] °336-881-5420 °Application Required ° °[facility] (men only)     °[address]      °[place], [place]     °[phone]      ° °[facility] °(Pregnant women only) °[address] °[place], [place] °336-275-0206 ° °[facility]      °[address]      °[place], [place] [address]     °336-722-9951      °       °[facility] °[address] °[place], [place] °336-723-1848 °90 day commitment/SA/Application process ° °[facility] only[facility]     °[address]     °[place], [place]     °336-748-1962       °Check-in at 7pm     °       °[facility] °[address] °[place], [place] [address] °336-248-6684 °Men/Women/Women and Children must be there by 7 pm ° °[facility] °[place],  °336-722-8721                ° °

## 2018-07-19 NOTE — ED Notes (Signed)
Patient was given Cookies and a Drink for Snack.
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
## 2018-07-19 NOTE — ED Notes (Signed)
Regular Diet was ordered for Lunch. 

## 2018-07-19 NOTE — ED Notes (Signed)
Breakfast tray ordered 

## 2019-10-16 IMAGING — DX CHEST - 2 VIEW
2 series · 2 of 2 positions shown · non-contrast
Comparison: 07/14/2018

CLINICAL DATA: Pt c/o mid-L sided CP, SOB and nausea. Pt states his
L arm also tingles. Pt states symptoms started this morning. Pt
states hx of COPD, though not on chart. Pt wearing mask, surgical
mask worn as PPE. Pt is also here for suicidal/homical thoughts.
Current smoker

EXAM:
CHEST - 2 VIEW

[chest pa]
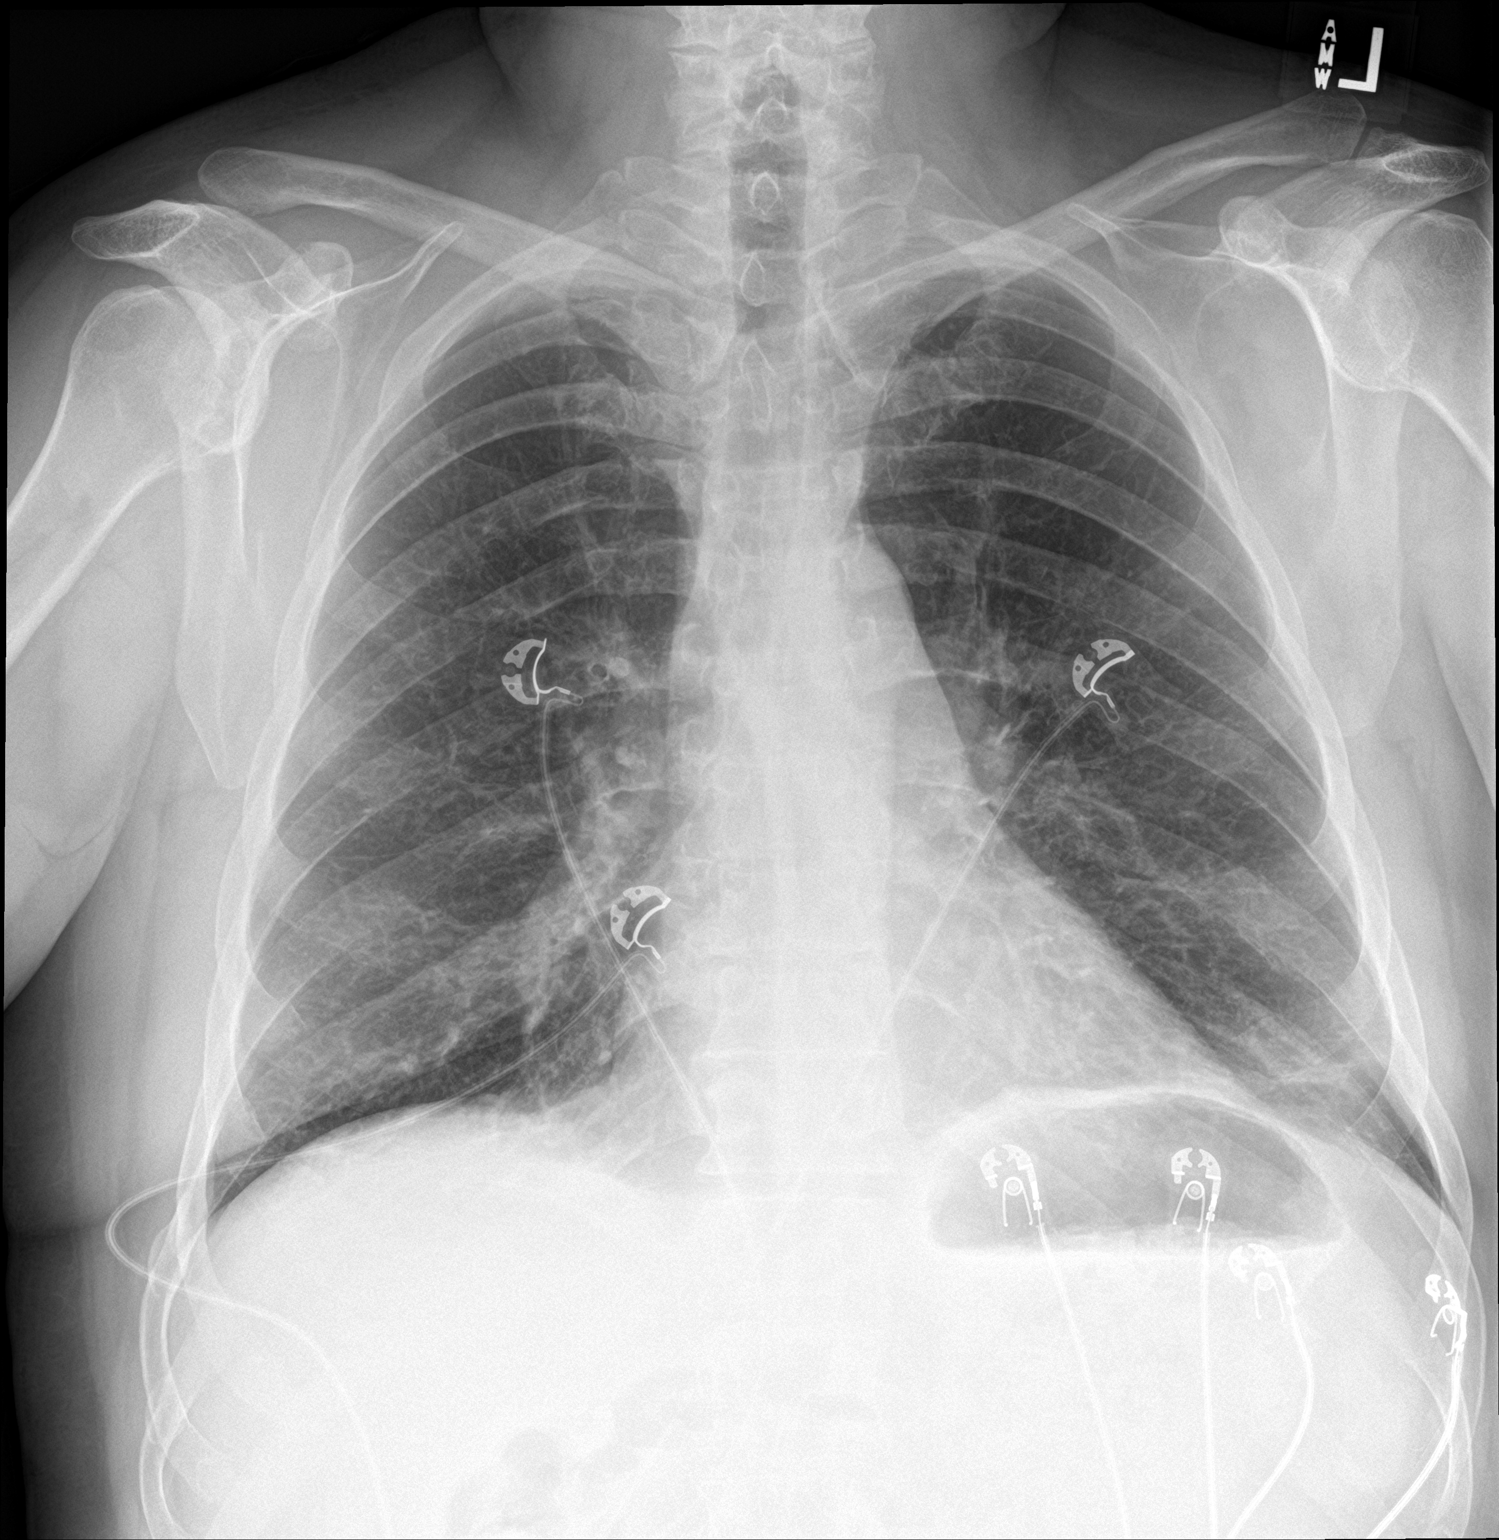

[chest lat]
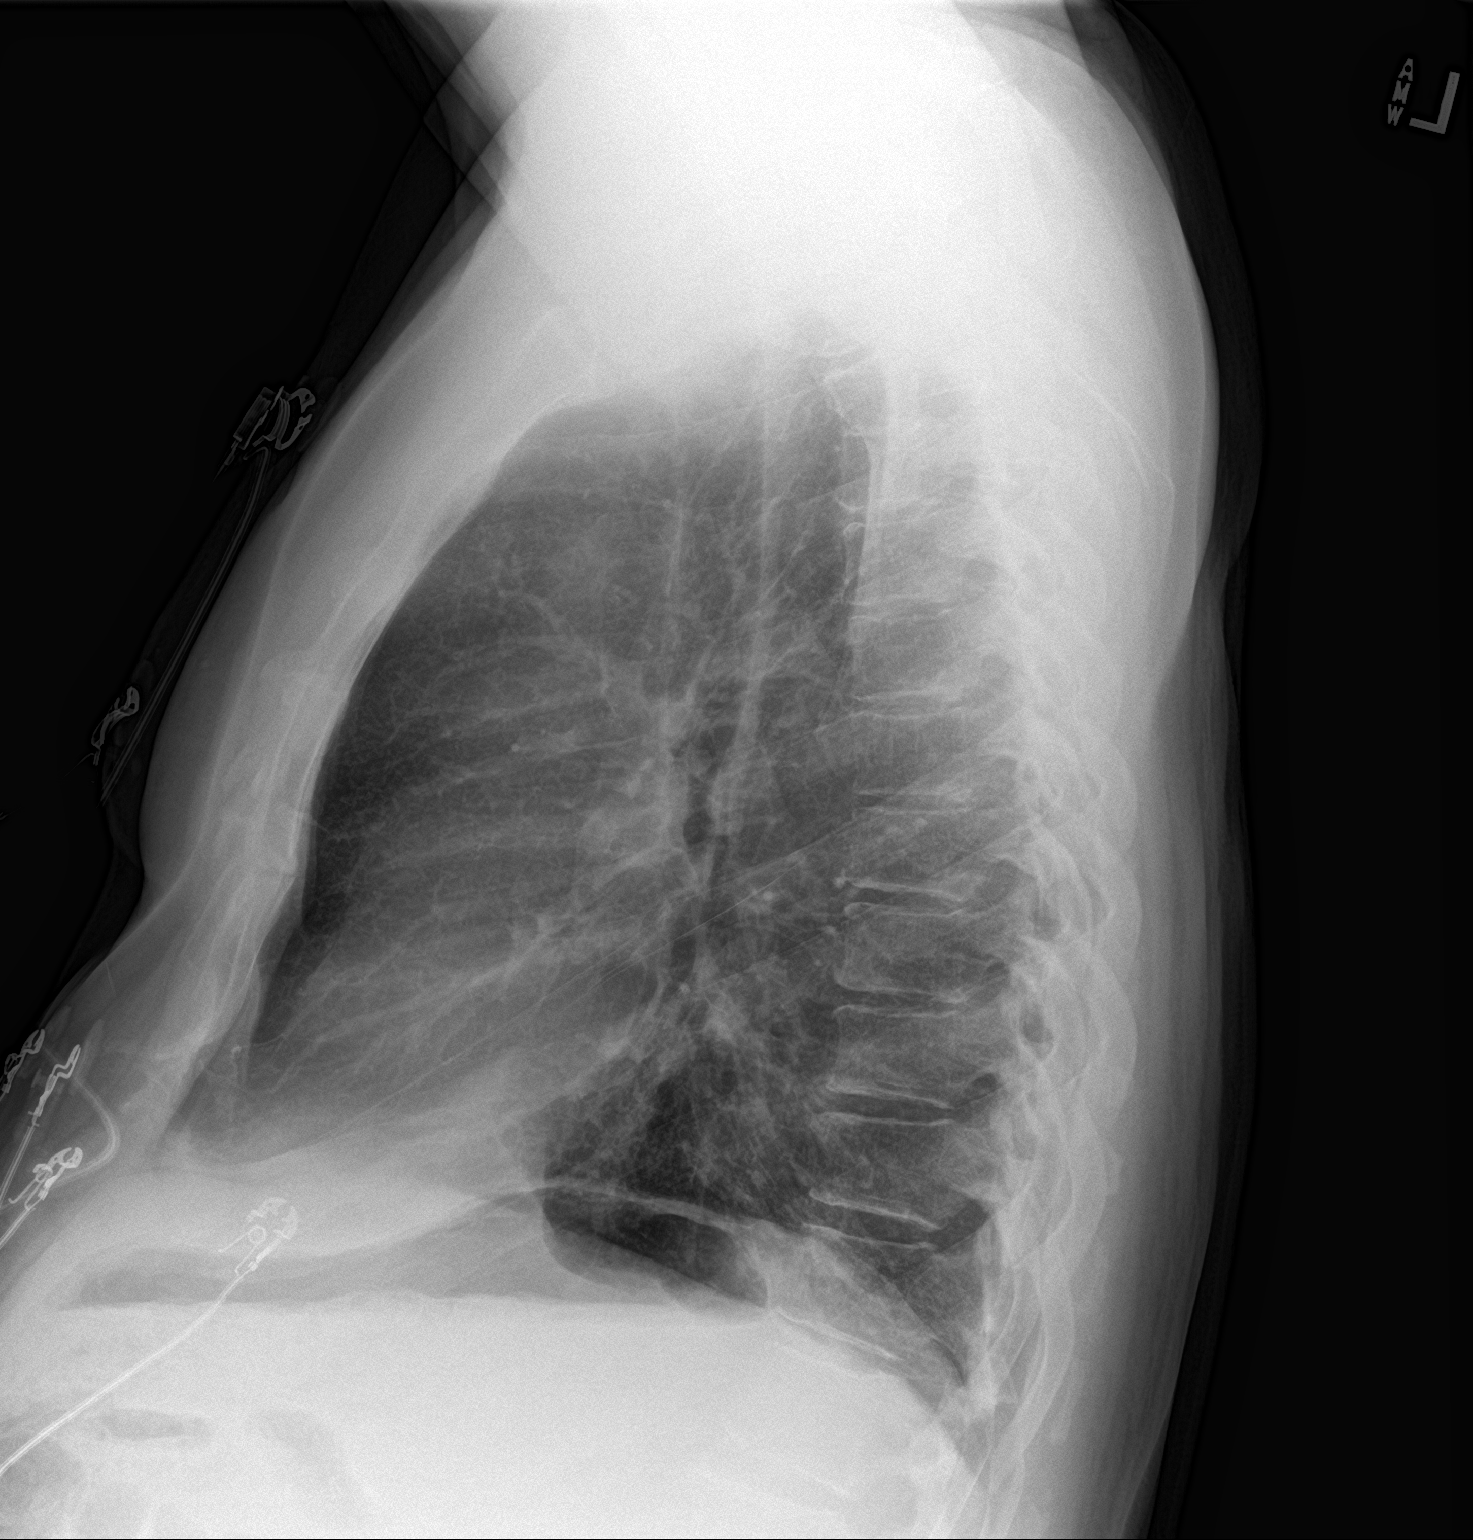

[2 of 2 positions shown; findings below may reference images not displayed]

FINDINGS: Cardiac silhouette is normal in size. No mediastinal or hilar
masses. No evidence of adenopathy.

Lungs are hyperexpanded. There are prominent bronchovascular
markings most evident in the bases, with mild scarring at the
lateral left lung base. No evidence of pneumonia or pulmonary edema.
No pleural effusion or pneumothorax.

Skeletal structures are intact.
IMPRESSION: No acute cardiopulmonary disease.

## 2020-04-10 ENCOUNTER — Telehealth: Payer: Self-pay

## 2020-04-10 NOTE — Telephone Encounter (Signed)
NOTES ON FILE FROM [facility] [phone], SENT REFERRAL TO SCHEDULING 

## 2020-05-04 ENCOUNTER — Ambulatory Visit (INDEPENDENT_AMBULATORY_CARE_PROVIDER_SITE_OTHER): Payer: Medicare Other | Admitting: Internal Medicine

## 2020-05-04 ENCOUNTER — Encounter: Payer: Self-pay | Admitting: Internal Medicine

## 2020-05-04 ENCOUNTER — Other Ambulatory Visit: Payer: Self-pay

## 2020-05-04 VITALS — BP 146/100 | HR 87 | Ht 67.0 in | Wt 226.0 lb

## 2020-05-04 DIAGNOSIS — I480 Paroxysmal atrial fibrillation: Secondary | ICD-10-CM | POA: Diagnosis not present

## 2020-05-04 DIAGNOSIS — I4891 Unspecified atrial fibrillation: Secondary | ICD-10-CM

## 2020-05-04 DIAGNOSIS — G4733 Obstructive sleep apnea (adult) (pediatric): Secondary | ICD-10-CM | POA: Diagnosis not present

## 2020-05-04 DIAGNOSIS — I10 Essential (primary) hypertension: Secondary | ICD-10-CM

## 2020-05-04 MED ORDER — DILTIAZEM HCL ER COATED BEADS 180 MG PO CP24: 180.0000 mg | ORAL_CAPSULE | Freq: Every day | ORAL | 3 refills | Status: DC

## 2020-05-04 MED ORDER — FLECAINIDE ACETATE 50 MG PO TABS: 50.0000 mg | ORAL_TABLET | Freq: Two times a day (BID) | ORAL | 3 refills | Status: DC
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
## 2020-05-04 NOTE — Patient Instructions (Signed)
Medication Instructions:  Stop Carvedilol Start Diltiazem 180 mg daily Start Flecainide 50 mg two times a day *If you need a refill on your cardiac medications before your next appointment, please call your pharmacy*   Lab Work: None If you have labs (blood work) drawn today and your tests are completely normal, you will receive your results only by: Marland Kitchen MyChart Message (if you have MyChart) OR . A paper copy in the mail If you have any lab test that is abnormal or we need to change your treatment, we will call you to review the results.   Testing/Procedures: none   Follow-Up: At [facility], you and your health needs are our priority.  As part of our continuing mission to provide you with exceptional heart care, we have created designated Provider Care Teams.  These Care Teams include your primary Cardiologist (physician) and Advanced Practice Providers (APPs -  Physician Assistants and Nurse Practitioners) who all work together to provide you with the care you need, when you need it.  We recommend signing up for the patient portal called "MyChart".  Sign up information is provided on this After Visit Summary.  MyChart is used to connect with patients for Virtual Visits (Telemedicine).  Patients are able to view lab/test results, encounter notes, upcoming appointments, etc.  Non-urgent messages can be sent to your provider as well.   To learn more about what you can do with MyChart, go to [email or web address].    Your next appointment:   2 week(s)  The format for your next appointment:   In Person  Provider:   You will follow up in the Atrial Fibrillation Clinic located at [facility]. Your provider will be: Rudi Coco, NP or Clint R. Fenton, PA-C   Other Instructions

## 2020-05-18 ENCOUNTER — Ambulatory Visit (HOSPITAL_COMMUNITY): Payer: Medicare Other | Admitting: Physician Assistant

## 2020-05-24 NOTE — Progress Notes (Signed)
Primary Care Physician: Elinor Dodge, MD Primary Cardiologist: Dr Hanley Hays  Primary Electrophysiologist: Dr Johney Frame Referring Physician: Dr Docia Barrier Bitner is a 60 y.o. male with a history of bipolar disorder, CKD, COPD, cocaine abuse, HTN, schizophrenia, OSA, and atrial fibrillation who presents for follow up in the [facility]rial [facility]ibrillation [facility].  The patient was initially diagnosed with atrial fibrillation ~2017 after presenting with symptoms of tachypalpitations. Patient is on Eliquis for a CHADS2VASC score of 2. He was seen by Dr Johney Frame on 05/04/20 and started on flecainide. He reports that his symptoms of palpitations are minimally improved. He denies any bleeding issues on anticoagulation. He has an appointment with his PCP to get on CPAP.   Today, he denies symptoms of chest pain, shortness of breath, orthopnea, PND, lower extremity edema, dizziness, presyncope, syncope, bleeding, or neurologic sequela. The patient is tolerating medications without difficulties and is otherwise without complaint today.    Atrial Fibrillation Risk Factors:  he does have symptoms or diagnosis of sleep apnea. he is not compliant with CPAP therapy. he does not have a history of rheumatic fever.   he has a BMI of Body mass index is 36.02 kg/m.Marland Kitchen Filed Weights   05/25/20 0850  Weight: 104.3 kg    Family History  Problem Relation Age of Onset  . CAD Father   . CAD Maternal Grandmother   . CAD Paternal Grandmother      Atrial Fibrillation Management history:  Previous antiarrhythmic drugs: flecainide Previous cardioversions: none Previous ablations: none CHADS2VASC score: 2 Anticoagulation history: Eliquis   Past Medical History:  Diagnosis Date  . Acid reflux   . Atrial fibrillation (HCC)   . Bipolar affective disorder (HCC)   . CHF (congestive heart failure) (HCC)   . Chronic kidney disease (CKD), stage III (moderate) (HCC)   . Chronic pain  syndrome   . COPD (chronic obstructive pulmonary disease) (HCC)   . DDD (degenerative disc disease), lumbosacral   . Depression with anxiety   . Hypertension   . IBS (irritable bowel syndrome)   . Migraines   . Obesity   . Obstructive sleep apnea    severe, does not have CPAP  . Pancreatitis, chronic (HCC)   . Schizophrenia (HCC)   . Substance abuse [facility])    Past Surgical History:  Procedure Laterality Date  . APPENDECTOMY    . CHOLECYSTECTOMY      Current Outpatient Medications  Medication Sig Dispense Refill  . acetaminophen (TYLENOL) 500 MG tablet Take 1 tablet by mouth 4 (four) times daily.    Marland Kitchen albuterol (PROVENTIL HFA;VENTOLIN HFA) 108 (90 Base) MCG/ACT inhaler Inhale 2 puffs into the lungs every 6 (six) hours as needed for wheezing or shortness of breath.    Marland Kitchen amitriptyline (ELAVIL) 25 MG tablet Take 25 mg by mouth at bedtime.    . diazepam (VALIUM) 5 MG tablet Take 5 mg by mouth daily as needed for pain.    Marland Kitchen dicyclomine (BENTYL) 20 MG tablet Take 20 mg by mouth 3 (three) times daily.    Marland Kitchen diltiazem (CARDIZEM CD) 180 MG 24 hr capsule Take 1 capsule (180 mg total) by mouth daily. 90 capsule 3  . diphenhydrAMINE HCl, Sleep, (ZZZQUIL PO) Take by mouth.    Everlene Balls 5 MG TABS tablet Take 5 mg by mouth 2 (two) times daily.    . flecainide (TAMBOCOR) 50 MG tablet Take 1 tablet (50 mg total) by mouth 2 (two) times daily. 180  tablet 3  . gabapentin (NEURONTIN) 800 MG tablet Take 800 mg by mouth 4 (four) times daily.    Marland Kitchen loratadine (CLARITIN) 10 MG tablet Take 10 mg by mouth daily.    Marland Kitchen losartan (COZAAR) 25 MG tablet Take 25 mg by mouth daily.    . pantoprazole (PROTONIX) 40 MG tablet Take 1 tablet by mouth daily.    Marland Kitchen VRAYLAR capsule Take 1.5 mg by mouth daily.     No current facility-administered medications for this encounter.    Allergies  Allergen Reactions  . Azithromycin Anaphylaxis, Shortness Of Breath and Other (See Comments)    Other reaction(s): Chest Pain   .  Doxycycline Itching and Rash  . Erythromycin Anaphylaxis  . Nsaids Shortness Of Breath  . Penicillins Anaphylaxis    Did it involve swelling of the face/tongue/throat, SOB, or low BP? Yes Did it involve sudden or severe rash/hives, skin peeling, or any reaction on the inside of your mouth or nose? Unknown Did you need to seek medical attention at a hospital or doctor's office? Unknown When did it last happen?20's (age) If all above answers are "NO", may proceed with cephalosporin use.   . Thorazine [Chlorpromazine] Shortness Of Breath  . Clindamycin/Lincomycin Rash  . Doxepin Other (See Comments)    Caused tremors   . Sulfa Antibiotics Rash  . Tolmetin Other (See Comments)    Causes kidney "problems"  . Tramadol Other (See Comments)    mixes with other medications     . Clonopin [Clonazepam] Other (See Comments)    hallucinations  . Darvocet [Propoxyphene N-Acetaminophen] Other (See Comments)    Chest pain  . Flurbiprofen Other (See Comments)  . Naprosyn [Naproxen] Rash    Social History   Socioeconomic History  . Marital status: Single    Spouse name: Not on file  . Number of children: Not on file  . Years of education: Not on file  . Highest education level: Not on file  Occupational History  . Not on file  Tobacco Use  . Smoking status: Current Every Day Smoker    Years: 52.00    Types: Cigarettes  . Smokeless tobacco: Never Used  . Tobacco comment: less than half pack  Substance and Sexual Activity  . Alcohol use: Yes    Comment: 1-2 beers per month  . Drug use: Yes    Types: Cocaine    Comment: ongoing occasional cocaine but not daily  . Sexual activity: Not on file  Other Topics Concern  . Not on file  Social History Narrative   Lives in [place] with ex sister in law   Disabled   Social Determinants of Health   Financial Resource Strain: Not on file  Food Insecurity: Not on file  Transportation Needs: Not on file  Physical Activity: Not on  file  Stress: Not on file  Social Connections: Not on file  Intimate Partner Violence: Not on file     ROS- All systems are reviewed and negative except as per the HPI above.  Physical Exam: Vitals:   05/25/20 0850  BP: 138/90  Pulse: 95  Weight: 104.3 kg  Height: 5\' 7"  (1.702 m)    GEN- The patient is well appearing obese male, alert and oriented x 3 today.   Head- normocephalic, atraumatic Eyes-  Sclera clear, conjunctiva pink Ears- hearing intact Oropharynx- clear Neck- supple  Lungs- Clear to ausculation bilaterally, normal work of breathing Heart- Regular rate and rhythm, no murmurs, rubs or gallops  GI- soft, NT, ND, + BS Extremities- no clubbing, cyanosis, or edema MS- no significant deformity or atrophy Skin- no rash or lesion Psych- euthymic mood, full affect Neuro- strength and sensation are intact  Wt Readings from Last 3 Encounters:  05/25/20 104.3 kg  05/04/20 102.5 kg  05/03/12 87.5 kg    EKG today demonstrates  SR, PACs Vent. rate 95 BPM PR interval 118 ms QRS duration 86 ms QT/QTc 358/449 ms  Epic records are reviewed at length today  CHA2DS2-VASc Score = 2  The patient's score is based upon: CHF History: Yes HTN History: Yes Diabetes History: No Stroke History: No Vascular Disease History: No Age Score: 0 Gender Score: 0      ASSESSMENT AND PLAN: 1. Paroxysmal Atrial Fibrillation (ICD10:  I48.0) The patient's CHA2DS2-VASc score is 2, indicating a 2.2% annual risk of stroke.   In SR today, but still having frequent heart racing. Continue Eliquis 5 mg BID Increase flecainide to 100 mg BID Continue diltiazem 180 mg daily Lifestyle modification was discussed and encouraged including cocaine cessation.   2. Secondary Hypercoagulable State (ICD10:  D68.69) The patient is at significant risk for stroke/thromboembolism based upon his CHA2DS2-VASc Score of 2.  Continue Apixaban (Eliquis).   3. Obesity Body mass index is 36.02  kg/m. Lifestyle modification was discussed at length including regular exercise and weight reduction.  4. Obstructive sleep apnea The importance of adequate treatment of sleep apnea was discussed today in order to improve our ability to maintain sinus rhythm long term. Patient has appointment with PCP 2/22 to get CPAP.  5. HTN Stable, no changes today.   Follow up in the AF clinic next week.    Jorja Loa PA-C [facility] [address] [place], [place] [address] [phone] 05/25/2020 8:56 AM

## 2020-05-25 ENCOUNTER — Ambulatory Visit (HOSPITAL_COMMUNITY)
Admission: RE | Admit: 2020-05-25 | Discharge: 2020-05-25 | Disposition: A | Discharge status: Home / Self Care | Payer: Medicare Other | Source: Ambulatory Visit | Attending: Physician Assistant | Admitting: Physician Assistant

## 2020-05-25 ENCOUNTER — Other Ambulatory Visit: Payer: Self-pay

## 2020-05-25 ENCOUNTER — Encounter (HOSPITAL_COMMUNITY): Payer: Self-pay | Admitting: Physician Assistant

## 2020-05-25 VITALS — BP 138/90 | HR 95 | Ht 67.0 in | Wt 230.0 lb

## 2020-05-25 DIAGNOSIS — I130 Hypertensive heart and chronic kidney disease with heart failure and stage 1 through stage 4 chronic kidney disease, or unspecified chronic kidney disease: Secondary | ICD-10-CM | POA: Insufficient documentation

## 2020-05-25 DIAGNOSIS — N183 Chronic kidney disease, stage 3 unspecified: Secondary | ICD-10-CM | POA: Insufficient documentation

## 2020-05-25 DIAGNOSIS — I480 Paroxysmal atrial fibrillation: Secondary | ICD-10-CM | POA: Diagnosis not present

## 2020-05-25 DIAGNOSIS — E669 Obesity, unspecified: Secondary | ICD-10-CM | POA: Diagnosis not present

## 2020-05-25 DIAGNOSIS — I509 Heart failure, unspecified: Secondary | ICD-10-CM | POA: Diagnosis not present

## 2020-05-25 DIAGNOSIS — Z6836 Body mass index (BMI) 36.0-36.9, adult: Secondary | ICD-10-CM | POA: Insufficient documentation

## 2020-05-25 DIAGNOSIS — F1410 Cocaine abuse, uncomplicated: Secondary | ICD-10-CM | POA: Insufficient documentation

## 2020-05-25 DIAGNOSIS — D6869 Other thrombophilia: Secondary | ICD-10-CM | POA: Insufficient documentation

## 2020-05-25 DIAGNOSIS — Z7901 Long term (current) use of anticoagulants: Secondary | ICD-10-CM | POA: Diagnosis not present

## 2020-05-25 DIAGNOSIS — Z8249 Family history of ischemic heart disease and other diseases of the circulatory system: Secondary | ICD-10-CM | POA: Insufficient documentation

## 2020-05-25 DIAGNOSIS — Z79899 Other long term (current) drug therapy: Secondary | ICD-10-CM | POA: Diagnosis not present

## 2020-05-25 DIAGNOSIS — G4733 Obstructive sleep apnea (adult) (pediatric): Secondary | ICD-10-CM | POA: Insufficient documentation

## 2020-05-25 DIAGNOSIS — F17210 Nicotine dependence, cigarettes, uncomplicated: Secondary | ICD-10-CM | POA: Diagnosis not present

## 2020-05-25 MED ORDER — FLECAINIDE ACETATE 100 MG PO TABS: ORAL_TABLET | ORAL | 3 refills | Status: DC

## 2020-05-25 NOTE — Patient Instructions (Signed)
Increase Flecainide 100mg - Take one tablet by mouth twice daily

## 2020-05-31 ENCOUNTER — Ambulatory Visit (HOSPITAL_COMMUNITY)
Admission: RE | Admit: 2020-05-31 | Discharge: 2020-05-31 | Disposition: A | Discharge status: Home / Self Care | Payer: Medicare Other | Source: Ambulatory Visit | Attending: Physician Assistant | Admitting: Physician Assistant

## 2020-05-31 ENCOUNTER — Other Ambulatory Visit: Payer: Self-pay

## 2020-05-31 VITALS — BP 142/80 | HR 92

## 2020-05-31 DIAGNOSIS — I4891 Unspecified atrial fibrillation: Secondary | ICD-10-CM | POA: Insufficient documentation

## 2020-05-31 DIAGNOSIS — I480 Paroxysmal atrial fibrillation: Secondary | ICD-10-CM

## 2020-05-31 MED ORDER — ELIQUIS 5 MG PO TABS: 5.0000 mg | ORAL_TABLET | Freq: Two times a day (BID) | ORAL | 3 refills | Status: AC

## 2020-05-31 NOTE — Progress Notes (Signed)
Patient returns for ECG after increasing flecainide. He feels much improved with less heart racing. ECG shows SR HR 92, PR 124, QRS 82, QTc 427. Continue present dose. Follow up in the AF clinic in 3 months.

## 2020-08-24 ENCOUNTER — Other Ambulatory Visit (HOSPITAL_COMMUNITY): Payer: Self-pay | Admitting: Physician Assistant

## 2020-08-28 ENCOUNTER — Ambulatory Visit (HOSPITAL_COMMUNITY): Payer: Medicare Other | Admitting: Physician Assistant

## 2020-09-05 ENCOUNTER — Ambulatory Visit (HOSPITAL_COMMUNITY): Payer: Medicare Other | Admitting: Physician Assistant

## 2020-09-14 ENCOUNTER — Ambulatory Visit (HOSPITAL_COMMUNITY): Payer: Medicare Other | Admitting: Physician Assistant

## 2020-09-20 ENCOUNTER — Ambulatory Visit (HOSPITAL_COMMUNITY): Payer: Medicare Other | Admitting: Physician Assistant

## 2020-09-26 ENCOUNTER — Ambulatory Visit (HOSPITAL_COMMUNITY): Payer: Medicare Other | Admitting: Physician Assistant

## 2020-10-10 ENCOUNTER — Ambulatory Visit (HOSPITAL_COMMUNITY)
Admission: RE | Admit: 2020-10-10 | Discharge: 2020-10-10 | Disposition: A | Discharge status: Home / Self Care | Payer: Medicare Other | Source: Ambulatory Visit | Attending: Physician Assistant | Admitting: Physician Assistant

## 2020-10-10 ENCOUNTER — Encounter (HOSPITAL_COMMUNITY): Payer: Self-pay | Admitting: Physician Assistant

## 2020-10-10 ENCOUNTER — Other Ambulatory Visit: Payer: Self-pay

## 2020-10-10 VITALS — BP 120/82 | HR 78 | Ht 67.0 in | Wt 230.4 lb

## 2020-10-10 DIAGNOSIS — Z886 Allergy status to analgesic agent status: Secondary | ICD-10-CM | POA: Insufficient documentation

## 2020-10-10 DIAGNOSIS — Z6836 Body mass index (BMI) 36.0-36.9, adult: Secondary | ICD-10-CM | POA: Diagnosis not present

## 2020-10-10 DIAGNOSIS — Z881 Allergy status to other antibiotic agents status: Secondary | ICD-10-CM | POA: Insufficient documentation

## 2020-10-10 DIAGNOSIS — I129 Hypertensive chronic kidney disease with stage 1 through stage 4 chronic kidney disease, or unspecified chronic kidney disease: Secondary | ICD-10-CM | POA: Insufficient documentation

## 2020-10-10 DIAGNOSIS — Z8249 Family history of ischemic heart disease and other diseases of the circulatory system: Secondary | ICD-10-CM | POA: Insufficient documentation

## 2020-10-10 DIAGNOSIS — Z79899 Other long term (current) drug therapy: Secondary | ICD-10-CM | POA: Diagnosis not present

## 2020-10-10 DIAGNOSIS — F1410 Cocaine abuse, uncomplicated: Secondary | ICD-10-CM | POA: Insufficient documentation

## 2020-10-10 DIAGNOSIS — I10 Essential (primary) hypertension: Secondary | ICD-10-CM | POA: Insufficient documentation

## 2020-10-10 DIAGNOSIS — I480 Paroxysmal atrial fibrillation: Secondary | ICD-10-CM | POA: Insufficient documentation

## 2020-10-10 DIAGNOSIS — Z882 Allergy status to sulfonamides status: Secondary | ICD-10-CM | POA: Diagnosis not present

## 2020-10-10 DIAGNOSIS — F319 Bipolar disorder, unspecified: Secondary | ICD-10-CM | POA: Diagnosis not present

## 2020-10-10 DIAGNOSIS — Z888 Allergy status to other drugs, medicaments and biological substances status: Secondary | ICD-10-CM | POA: Insufficient documentation

## 2020-10-10 DIAGNOSIS — D6869 Other thrombophilia: Secondary | ICD-10-CM | POA: Insufficient documentation

## 2020-10-10 DIAGNOSIS — E669 Obesity, unspecified: Secondary | ICD-10-CM | POA: Diagnosis not present

## 2020-10-10 DIAGNOSIS — Z7182 Exercise counseling: Secondary | ICD-10-CM | POA: Insufficient documentation

## 2020-10-10 DIAGNOSIS — J449 Chronic obstructive pulmonary disease, unspecified: Secondary | ICD-10-CM | POA: Insufficient documentation

## 2020-10-10 DIAGNOSIS — Z880 Allergy status to penicillin: Secondary | ICD-10-CM | POA: Insufficient documentation

## 2020-10-10 DIAGNOSIS — G4733 Obstructive sleep apnea (adult) (pediatric): Secondary | ICD-10-CM | POA: Insufficient documentation

## 2020-10-10 DIAGNOSIS — Z7901 Long term (current) use of anticoagulants: Secondary | ICD-10-CM | POA: Diagnosis not present

## 2020-10-10 DIAGNOSIS — F17210 Nicotine dependence, cigarettes, uncomplicated: Secondary | ICD-10-CM | POA: Insufficient documentation

## 2020-10-10 DIAGNOSIS — N189 Chronic kidney disease, unspecified: Secondary | ICD-10-CM | POA: Diagnosis not present

## 2020-10-10 MED ORDER — DILTIAZEM HCL ER COATED BEADS 240 MG PO CP24: 240.0000 mg | ORAL_CAPSULE | Freq: Every day | ORAL | 3 refills | Status: AC

## 2020-10-10 NOTE — Progress Notes (Signed)
Primary Care Physician: Elinor Dodge, MD Primary Cardiologist: Dr Hanley Hays  Primary Electrophysiologist: Dr Johney Frame Referring Physician: Dr Docia Barrier Vanputten is a 60 y.o. male with a history of bipolar disorder, CKD, COPD, cocaine abuse, HTN, schizophrenia, OSA, and atrial fibrillation who presents for follow up in the [facility].  The patient was initially diagnosed with atrial fibrillation ~2017 after presenting with symptoms of tachypalpitations. Patient is on Eliquis for a CHADS2VASC score of 2. He was seen by Dr Johney Frame on 05/04/20 and started on flecainide.   On follow up today, patient reports that he has increased palpitations with activity over the last several weeks. He is not on CPAP. He denies any alcohol or substance use in the last 3 months. He denies any bleeding issues on anticoagulation.   Today, he denies symptoms of chest pain, shortness of breath, orthopnea, PND, lower extremity edema, dizziness, presyncope, syncope, bleeding, or neurologic sequela. The patient is tolerating medications without difficulties and is otherwise without complaint today.    Atrial Fibrillation Risk Factors:  he does have symptoms or diagnosis of sleep apnea. he is not compliant with CPAP therapy. he does not have a history of rheumatic fever.   he has a BMI of Body mass index is 36.09 kg/m.Marland Kitchen Filed Weights   10/10/20 0908  Weight: 104.5 kg     Family History  Problem Relation Age of Onset   CAD Father    CAD Maternal Grandmother    CAD Paternal Grandmother      Atrial Fibrillation Management history:  Previous antiarrhythmic drugs: flecainide Previous cardioversions: none Previous ablations: none CHADS2VASC score: 2 Anticoagulation history: Eliquis   Past Medical History:  Diagnosis Date   Acid reflux    Atrial fibrillation (HCC)    Bipolar affective disorder (HCC)    CHF (congestive heart failure) (HCC)    Chronic  kidney disease (CKD), stage III (moderate) (HCC)    Chronic pain syndrome    COPD (chronic obstructive pulmonary disease) (HCC)    DDD (degenerative disc disease), lumbosacral    Depression with anxiety    Hypertension    IBS (irritable bowel syndrome)    Migraines    Obesity    Obstructive sleep apnea    severe, does not have CPAP   Pancreatitis, chronic (HCC)    Schizophrenia (HCC)    Substance abuse (HCC)    Past Surgical History:  Procedure Laterality Date   APPENDECTOMY     CHOLECYSTECTOMY      Current Outpatient Medications  Medication Sig Dispense Refill   acetaminophen (TYLENOL) 500 MG tablet Take 1 tablet by mouth 4 (four) times daily.     albuterol (PROVENTIL HFA;VENTOLIN HFA) 108 (90 Base) MCG/ACT inhaler Inhale 2 puffs into the lungs every 6 (six) hours as needed for wheezing or shortness of breath.     amitriptyline (ELAVIL) 25 MG tablet Take 25 mg by mouth at bedtime.     dicyclomine (BENTYL) 20 MG tablet Take 20 mg by mouth 3 (three) times daily.     ELIQUIS 5 MG TABS tablet Take 1 tablet (5 mg total) by mouth 2 (two) times daily. 60 tablet 3   flecainide (TAMBOCOR) 100 MG tablet TAKE 1 TABLET BY MOUTH TWICE DAILY 60 tablet 3   gabapentin (NEURONTIN) 800 MG tablet Take 800 mg by mouth 4 (four) times daily.     losartan (COZAAR) 25 MG tablet Take 25 mg by mouth daily.  mupirocin ointment (BACTROBAN) 2 % SMARTSIG:Sparingly Topical 2-3 Times Daily     pantoprazole (PROTONIX) 40 MG tablet Take 1 tablet by mouth daily.     sildenafil (VIAGRA) 100 MG tablet Take 50-100 mg by mouth daily as needed.     triamcinolone cream (KENALOG) 0.1 % SMARTSIG:1 Application Topical 2-3 Times Daily     VRAYLAR capsule Take 1.5 mg by mouth daily.     diltiazem (CARDIZEM CD) 240 MG 24 hr capsule Take 1 capsule (240 mg total) by mouth daily. 30 capsule 3   No current facility-administered medications for this encounter.    Allergies  Allergen Reactions   Azithromycin Anaphylaxis,  Shortness Of Breath and Other (See Comments)    Other reaction(s): Chest Pain    Doxycycline Itching and Rash   Erythromycin Anaphylaxis   Nsaids Shortness Of Breath   Penicillins Anaphylaxis    Did it involve swelling of the face/tongue/throat, SOB, or low BP? Yes Did it involve sudden or severe rash/hives, skin peeling, or any reaction on the inside of your mouth or nose? Unknown Did you need to seek medical attention at a hospital or doctor's office? Unknown When did it last happen?    60's (age)   If all above answers are "NO", may proceed with cephalosporin use.    Thorazine [Chlorpromazine] Shortness Of Breath   Clindamycin/Lincomycin Rash   Doxepin Other (See Comments)    Caused tremors    Sulfa Antibiotics Rash   Tolmetin Other (See Comments)    Causes kidney "problems"   Tramadol Other (See Comments)    mixes with other medications      Clonopin [Clonazepam] Other (See Comments)    hallucinations   Darvocet [Propoxyphene N-Acetaminophen] Other (See Comments)    Chest pain   Flurbiprofen Other (See Comments)   Naprosyn [Naproxen] Rash    Social History   Socioeconomic History   Marital status: Single    Spouse name: Not on file   Number of children: Not on file   Years of education: Not on file   Highest education level: Not on file  Occupational History   Not on file  Tobacco Use   Smoking status: Every Day    Years: 52.00    Pack years: 0.00    Types: Cigarettes   Smokeless tobacco: Never   Tobacco comments:    less than half pack  Substance and Sexual Activity   Alcohol use: Yes    Comment: 1-2 beers per month   Drug use: Yes    Types: Cocaine    Comment: ongoing occasional cocaine but not daily   Sexual activity: Not on file  Other Topics Concern   Not on file  Social History Narrative   Lives in [place] with ex sister in law   Disabled   Social Determinants of Health   Financial Resource Strain: Not on file  Food Insecurity: Not on  file  Transportation Needs: Not on file  Physical Activity: Not on file  Stress: Not on file  Social Connections: Not on file  Intimate Partner Violence: Not on file     ROS- All systems are reviewed and negative except as per the HPI above.  Physical Exam: Vitals:   10/10/20 0908  BP: 120/82  Pulse: 78  Weight: 104.5 kg  Height: 5\' 7"  (1.702 m)    GEN- The patient is a well appearing obese male, alert and oriented x 3 today.   HEENT-head normocephalic, atraumatic, sclera clear, conjunctiva pink,  hearing intact, trachea midline. Lungs- Clear to ausculation bilaterally, normal work of breathing Heart- Regular rate and rhythm, no murmurs, rubs or gallops  GI- soft, NT, ND, + BS Extremities- no clubbing, cyanosis, or edema MS- no significant deformity or atrophy Skin- no rash or lesion Psych- euthymic mood, full affect Neuro- strength and sensation are intact   Wt Readings from Last 3 Encounters:  10/10/20 104.5 kg  05/25/20 104.3 kg  05/04/20 102.5 kg    EKG today demonstrates  SR Vent. rate 78 BPM PR interval 126 ms QRS duration 86 ms QT/QTcB 390/444 ms  Echo 11/26/18 EF 55%, normal LA size/ function, no significant valve disease  Epic records are reviewed at length today  CHA2DS2-VASc Score = 2  The patient's score is based upon: CHF History: Yes HTN History: Yes Diabetes History: No Stroke History: No Vascular Disease History: No Age Score: 0 Gender Score: 0      ASSESSMENT AND PLAN: 1. Paroxysmal Atrial Fibrillation (ICD10:  I48.0) The patient's CHA2DS2-VASc score is 2, indicating a 2.2% annual risk of stroke.   Patient having more frequent palpitations. ? Afib vs deconditioning  Increase diltiazem to 240 mg daily Continue Eliquis 5 mg BID Continue flecainide 100 mg BID  2. Secondary Hypercoagulable State (ICD10:  D68.69) The patient is at significant risk for stroke/thromboembolism based upon his CHA2DS2-VASc Score of 2.  Continue Apixaban  (Eliquis).   3. Obesity Body mass index is 36.09 kg/m. Lifestyle modification was discussed and encouraged including regular physical activity and weight reduction.  4. Obstructive sleep apnea Stressed importance of treating OSA in order to maintain SR. Patient concerned about insects getting into his machine because of his current living situation. Encouraged him to d/w PCP if there are any alternatives.   5. HTN Stable, no changes today.  6. Substance abuse Patient denies any cocaine use since his last visit.     Follow up in the AF clinic in 3 weeks.    Jorja Loa PA-C Afib Clinic [facility] [address] [place], [place] [address] [phone] 10/10/2020 9:37 AM

## 2020-10-10 NOTE — Patient Instructions (Signed)
Increase cardizem to 240mg once a day   

## 2020-10-31 ENCOUNTER — Ambulatory Visit (HOSPITAL_COMMUNITY)
Admission: RE | Admit: 2020-10-31 | Discharge: 2020-10-31 | Disposition: A | Discharge status: Home / Self Care | Payer: Medicare Other | Source: Ambulatory Visit | Attending: Physician Assistant | Admitting: Physician Assistant

## 2020-10-31 ENCOUNTER — Encounter (HOSPITAL_COMMUNITY): Payer: Self-pay | Admitting: Physician Assistant

## 2020-10-31 ENCOUNTER — Other Ambulatory Visit: Payer: Self-pay

## 2020-10-31 VITALS — BP 130/92 | HR 60 | Ht 67.0 in | Wt 231.8 lb

## 2020-10-31 DIAGNOSIS — I480 Paroxysmal atrial fibrillation: Secondary | ICD-10-CM | POA: Insufficient documentation

## 2020-10-31 DIAGNOSIS — E669 Obesity, unspecified: Secondary | ICD-10-CM | POA: Diagnosis not present

## 2020-10-31 DIAGNOSIS — G4733 Obstructive sleep apnea (adult) (pediatric): Secondary | ICD-10-CM | POA: Diagnosis not present

## 2020-10-31 DIAGNOSIS — Z79899 Other long term (current) drug therapy: Secondary | ICD-10-CM | POA: Insufficient documentation

## 2020-10-31 DIAGNOSIS — Z885 Allergy status to narcotic agent status: Secondary | ICD-10-CM | POA: Diagnosis not present

## 2020-10-31 DIAGNOSIS — Z7901 Long term (current) use of anticoagulants: Secondary | ICD-10-CM | POA: Insufficient documentation

## 2020-10-31 DIAGNOSIS — Z713 Dietary counseling and surveillance: Secondary | ICD-10-CM | POA: Insufficient documentation

## 2020-10-31 DIAGNOSIS — F1410 Cocaine abuse, uncomplicated: Secondary | ICD-10-CM | POA: Insufficient documentation

## 2020-10-31 DIAGNOSIS — F17210 Nicotine dependence, cigarettes, uncomplicated: Secondary | ICD-10-CM | POA: Insufficient documentation

## 2020-10-31 DIAGNOSIS — R0602 Shortness of breath: Secondary | ICD-10-CM | POA: Diagnosis not present

## 2020-10-31 DIAGNOSIS — Z8249 Family history of ischemic heart disease and other diseases of the circulatory system: Secondary | ICD-10-CM | POA: Diagnosis not present

## 2020-10-31 DIAGNOSIS — Z880 Allergy status to penicillin: Secondary | ICD-10-CM | POA: Diagnosis not present

## 2020-10-31 DIAGNOSIS — R002 Palpitations: Secondary | ICD-10-CM | POA: Diagnosis not present

## 2020-10-31 DIAGNOSIS — D6869 Other thrombophilia: Secondary | ICD-10-CM | POA: Insufficient documentation

## 2020-10-31 DIAGNOSIS — Z6836 Body mass index (BMI) 36.0-36.9, adult: Secondary | ICD-10-CM | POA: Insufficient documentation

## 2020-10-31 DIAGNOSIS — I10 Essential (primary) hypertension: Secondary | ICD-10-CM | POA: Diagnosis not present

## 2020-10-31 DIAGNOSIS — R079 Chest pain, unspecified: Secondary | ICD-10-CM | POA: Diagnosis not present

## 2020-10-31 NOTE — Progress Notes (Signed)
Primary Care Physician: Elinor Dodge, MD Primary Cardiologist: Dr Hanley Hays  Primary Electrophysiologist: Dr Johney Frame Referring Physician: Dr Docia Barrier Zullo is a 60 y.o. male with a history of bipolar disorder, CKD, COPD, cocaine abuse, HTN, schizophrenia, OSA, and atrial fibrillation who presents for follow up in the [facility].  The patient was initially diagnosed with atrial fibrillation ~2017 after presenting with symptoms of tachypalpitations. Patient is on Eliquis for a CHADS2VASC score of 2. He was seen by Dr Johney Frame on 05/04/20 and started on flecainide.   On follow up today, patient reports that his palpitations are better but he does have exertional chest tightness and SOB. He admits that he has not been taking his morning doses of medications due to fear of dependence.   Today, he denies symptoms of palpitations, orthopnea, PND, lower extremity edema, dizziness, presyncope, syncope, bleeding, or neurologic sequela. The patient is tolerating medications without difficulties and is otherwise without complaint today.    Atrial Fibrillation Risk Factors:  he does have symptoms or diagnosis of sleep apnea. he is not compliant with CPAP therapy. he does not have a history of rheumatic fever.   he has a BMI of Body mass index is 36.31 kg/m.Marland Kitchen Filed Weights   10/31/20 0913  Weight: 105.1 kg      Family History  Problem Relation Age of Onset   CAD Father    CAD Maternal Grandmother    CAD Paternal Grandmother      Atrial Fibrillation Management history:  Previous antiarrhythmic drugs: flecainide Previous cardioversions: none Previous ablations: none CHADS2VASC score: 2 Anticoagulation history: Eliquis   Past Medical History:  Diagnosis Date   Acid reflux    Atrial fibrillation (HCC)    Bipolar affective disorder (HCC)    CHF (congestive heart failure) (HCC)    Chronic kidney disease (CKD), stage III  (moderate) (HCC)    Chronic pain syndrome    COPD (chronic obstructive pulmonary disease) (HCC)    DDD (degenerative disc disease), lumbosacral    Depression with anxiety    Hypertension    IBS (irritable bowel syndrome)    Migraines    Obesity    Obstructive sleep apnea    severe, does not have CPAP   Pancreatitis, chronic (HCC)    Schizophrenia (HCC)    Substance abuse (HCC)    Past Surgical History:  Procedure Laterality Date   APPENDECTOMY     CHOLECYSTECTOMY      Current Outpatient Medications  Medication Sig Dispense Refill   acetaminophen (TYLENOL) 500 MG tablet Take 1 tablet by mouth 4 (four) times daily.     albuterol (PROVENTIL HFA;VENTOLIN HFA) 108 (90 Base) MCG/ACT inhaler Inhale 2 puffs into the lungs every 6 (six) hours as needed for wheezing or shortness of breath.     amitriptyline (ELAVIL) 25 MG tablet Take 25 mg by mouth at bedtime.     diazepam (VALIUM) 5 MG tablet Take 5 mg by mouth daily as needed.     dicyclomine (BENTYL) 20 MG tablet Take 20 mg by mouth 3 (three) times daily.     diltiazem (CARDIZEM CD) 240 MG 24 hr capsule Take 1 capsule (240 mg total) by mouth daily. 30 capsule 3   ELIQUIS 5 MG TABS tablet Take 1 tablet (5 mg total) by mouth 2 (two) times daily. 60 tablet 3   flecainide (TAMBOCOR) 100 MG tablet TAKE 1 TABLET BY MOUTH TWICE DAILY 60 tablet 3   gabapentin (  NEURONTIN) 800 MG tablet Take 800 mg by mouth 4 (four) times daily.     losartan (COZAAR) 25 MG tablet Take 25 mg by mouth daily.     pantoprazole (PROTONIX) 40 MG tablet Take 1 tablet by mouth daily.     VRAYLAR capsule Take 1.5 mg by mouth daily.     No current facility-administered medications for this encounter.    Allergies  Allergen Reactions   Azithromycin Anaphylaxis, Shortness Of Breath and Other (See Comments)    Other reaction(s): Chest Pain    Doxycycline Itching and Rash   Erythromycin Anaphylaxis   Nsaids Shortness Of Breath   Penicillins Anaphylaxis    Did it  involve swelling of the face/tongue/throat, SOB, or low BP? Yes Did it involve sudden or severe rash/hives, skin peeling, or any reaction on the inside of your mouth or nose? Unknown Did you need to seek medical attention at a hospital or doctor's office? Unknown When did it last happen?    89's (age)   If all above answers are "NO", may proceed with cephalosporin use.    Thorazine [Chlorpromazine] Shortness Of Breath   Clindamycin/Lincomycin Rash   Doxepin Other (See Comments)    Caused tremors    Sulfa Antibiotics Rash   Tolmetin Other (See Comments)    Causes kidney "problems"   Tramadol Other (See Comments)    mixes with other medications      Clonopin [Clonazepam] Other (See Comments)    hallucinations   Darvocet [Propoxyphene N-Acetaminophen] Other (See Comments)    Chest pain   Flurbiprofen Other (See Comments)   Naprosyn [Naproxen] Rash    Social History   Socioeconomic History   Marital status: Single    Spouse name: Not on file   Number of children: Not on file   Years of education: Not on file   Highest education level: Not on file  Occupational History   Not on file  Tobacco Use   Smoking status: Every Day    Years: 52.00    Types: Cigarettes   Smokeless tobacco: Never   Tobacco comments:    half pack daily 10/31/20  Substance and Sexual Activity   Alcohol use: Yes    Comment: 1-2 beers per month   Drug use: Yes    Types: Cocaine    Comment: ongoing occasional cocaine but not daily   Sexual activity: Not on file  Other Topics Concern   Not on file  Social History Narrative   Lives in [place] with ex sister in law   Disabled   Social Determinants of Health   Financial Resource Strain: Not on file  Food Insecurity: Not on file  Transportation Needs: Not on file  Physical Activity: Not on file  Stress: Not on file  Social Connections: Not on file  Intimate Partner Violence: Not on file     ROS- All systems are reviewed and negative except  as per the HPI above.  Physical Exam: Vitals:   10/31/20 0913  BP: (!) 130/92  Pulse: 60  Weight: 105.1 kg  Height: 5\' 7"  (1.702 m)    GEN- The patient is a well appearing obese male, alert and oriented x 3 today.   HEENT-head normocephalic, atraumatic, sclera clear, conjunctiva pink, hearing intact, trachea midline. Lungs- Clear to ausculation bilaterally, normal work of breathing Heart- Regular rate and rhythm, no murmurs, rubs or gallops  GI- soft, NT, ND, + BS Extremities- no clubbing, cyanosis, or edema MS- no significant deformity or  atrophy Skin- no rash or lesion Psych- euthymic mood, full affect Neuro- strength and sensation are intact   Wt Readings from Last 3 Encounters:  10/31/20 105.1 kg  10/10/20 104.5 kg  05/25/20 104.3 kg    EKG today demonstrates  SR Vent. rate 60 BPM PR interval 140 ms QRS duration 86 ms QT/QTcB 426/426 ms  Echo 11/26/18 EF 55%, normal LA size/ function, no significant valve disease  Epic records are reviewed at length today  CHA2DS2-VASc Score = 2  The patient's score is based upon: CHF History: Yes HTN History: Yes Diabetes History: No Stroke History: No Vascular Disease History: No Age Score: 0 Gender Score: 0      ASSESSMENT AND PLAN: 1. Paroxysmal Atrial Fibrillation (ICD10:  I48.0) The patient's CHA2DS2-VASc score is 2, indicating a 2.2% annual risk of stroke.   Patient in SR. Stressed importance of taking medications as prescribed.  Continue diltiazem 240 mg daily Continue Eliquis 5 mg BID Continue flecainide 100 mg BID  2. Secondary Hypercoagulable State (ICD10:  D68.69) The patient is at significant risk for stroke/thromboembolism based upon his CHA2DS2-VASc Score of 2.  Continue Apixaban (Eliquis).   3. Obesity Body mass index is 36.31 kg/m. Lifestyle modification was discussed and encouraged including regular physical activity and weight reduction.  4. Obstructive sleep apnea Patient has an appointment  with [facility] for sleep evaluation.   5. HTN Stable, no changes today.  6. Substance abuse Patient denies cocaine use since last visit.  7. Chest pain Patient having exertional chest discomfort associated with SOB. ? Angina vs deconditioning. Offered stress test for evaluation. He would like to reach out to Dr Hanley Hays his primary cardiologist for evaluation. ED precautions given.    Follow up in the AF clinic in 3 months.    Jorja Loa PA-C Afib Clinic [facility] [address] [place], [place] [address] [phone] 10/31/2020 11:29 AM

## 2021-01-30 ENCOUNTER — Ambulatory Visit (HOSPITAL_COMMUNITY): Payer: Medicaid Other | Admitting: Physician Assistant

## 2021-10-08 ENCOUNTER — Emergency Department (HOSPITAL_BASED_OUTPATIENT_CLINIC_OR_DEPARTMENT_OTHER): Payer: Medicare Other

## 2021-10-08 ENCOUNTER — Emergency Department (HOSPITAL_BASED_OUTPATIENT_CLINIC_OR_DEPARTMENT_OTHER)
Admission: EM | Admit: 2021-10-08 | Discharge: 2021-10-08 | Disposition: A | Discharge status: Home / Self Care | Payer: Medicare Other | Attending: Emergency Medicine | Admitting: Emergency Medicine

## 2021-10-08 ENCOUNTER — Encounter (HOSPITAL_BASED_OUTPATIENT_CLINIC_OR_DEPARTMENT_OTHER): Payer: Self-pay | Admitting: Emergency Medicine

## 2021-10-08 ENCOUNTER — Other Ambulatory Visit: Payer: Self-pay

## 2021-10-08 DIAGNOSIS — K4090 Unilateral inguinal hernia, without obstruction or gangrene, not specified as recurrent: Secondary | ICD-10-CM | POA: Diagnosis not present

## 2021-10-08 DIAGNOSIS — Z7901 Long term (current) use of anticoagulants: Secondary | ICD-10-CM | POA: Insufficient documentation

## 2021-10-08 DIAGNOSIS — R109 Unspecified abdominal pain: Secondary | ICD-10-CM | POA: Diagnosis present

## 2021-10-08 LAB — CBC WITH DIFFERENTIAL/PLATELET
Abs Immature Granulocytes: 0.01 10*3/uL (ref 0.00–0.07)
Basophils Absolute: 0 10*3/uL (ref 0.0–0.1)
Basophils Relative: 0 %
Eosinophils Absolute: 0.2 10*3/uL (ref 0.0–0.5)
Eosinophils Relative: 2 %
HCT: 45 % (ref 39.0–52.0)
Hemoglobin: 15.4 g/dL (ref 13.0–17.0)
Immature Granulocytes: 0 %
Lymphocytes Relative: 34 %
Lymphs Abs: 2.6 10*3/uL (ref 0.7–4.0)
MCH: 32.8 pg (ref 26.0–34.0)
MCHC: 34.2 g/dL (ref 30.0–36.0)
MCV: 95.7 fL (ref 80.0–100.0)
Monocytes Absolute: 0.6 10*3/uL (ref 0.1–1.0)
Monocytes Relative: 8 %
Neutro Abs: 4.2 10*3/uL (ref 1.7–7.7)
Neutrophils Relative %: 56 %
Platelets: 197 10*3/uL (ref 150–400)
RBC: 4.7 MIL/uL (ref 4.22–5.81)
RDW: 13.3 % (ref 11.5–15.5)
WBC: 7.7 10*3/uL (ref 4.0–10.5)
nRBC: 0 % (ref 0.0–0.2)

## 2021-10-08 LAB — BASIC METABOLIC PANEL
Anion gap: 6 (ref 5–15)
BUN: 27 mg/dL — ABNORMAL HIGH (ref 6–20)
CO2: 25 mmol/L (ref 22–32)
Calcium: 8.9 mg/dL (ref 8.9–10.3)
Chloride: 108 mmol/L (ref 98–111)
Creatinine, Ser: 1.34 mg/dL — ABNORMAL HIGH (ref 0.61–1.24)
GFR, Estimated: >60 mL/min (ref >60)
Glucose, Bld: 115 mg/dL — ABNORMAL HIGH (ref 70–99)
Potassium: 3.6 mmol/L (ref 3.5–5.1)
Sodium: 139 mmol/L (ref 135–145)

## 2021-10-08 LAB — LACTIC ACID, PLASMA: Lactic Acid, Venous: 0.7 mmol/L (ref 0.5–1.9)

## 2021-10-08 MED ORDER — MORPHINE SULFATE (PF) 4 MG/ML IV SOLN
3.0000 mg | Freq: Once | INTRAVENOUS | Status: AC
  Administered 2021-10-08: 3 mg via INTRAVENOUS
  Filled 2021-10-08: qty 1

## 2021-10-08 MED ORDER — ONDANSETRON HCL 4 MG/2ML IJ SOLN
4.0000 mg | Freq: Once | INTRAMUSCULAR | Status: AC
  Administered 2021-10-08: 4 mg via INTRAVENOUS
  Filled 2021-10-08: qty 2

## 2021-10-08 MED ORDER — ACETAMINOPHEN 325 MG PO TABS: 650.0000 mg | ORAL_TABLET | Freq: Four times a day (QID) | ORAL | 0 refills | Status: DC | PRN

## 2021-10-08 MED ORDER — SENNOSIDES-DOCUSATE SODIUM 8.6-50 MG PO TABS: 1.0000 | ORAL_TABLET | Freq: Every day | ORAL | 0 refills | Status: AC

## 2021-10-08 MED ORDER — MORPHINE SULFATE (PF) 4 MG/ML IV SOLN
4.0000 mg | Freq: Once | INTRAVENOUS | Status: AC
  Administered 2021-10-08: 4 mg via INTRAVENOUS
  Filled 2021-10-08: qty 1

## 2021-10-08 MED ORDER — OXYCODONE HCL 5 MG PO TABS: 5.0000 mg | ORAL_TABLET | Freq: Four times a day (QID) | ORAL | 0 refills | Status: DC | PRN

## 2021-10-08 MED ORDER — IOHEXOL 300 MG/ML  SOLN
100.0000 mL | Freq: Once | INTRAMUSCULAR | Status: AC | PRN
  Administered 2021-10-08: 100 mL via INTRAVENOUS

## 2021-10-12 ENCOUNTER — Emergency Department (HOSPITAL_BASED_OUTPATIENT_CLINIC_OR_DEPARTMENT_OTHER)
Admission: EM | Admit: 2021-10-12 | Discharge: 2021-10-12 | Disposition: A | Discharge status: Home / Self Care | Payer: Medicare Other | Attending: Emergency Medicine | Admitting: Emergency Medicine

## 2021-10-12 ENCOUNTER — Encounter (HOSPITAL_BASED_OUTPATIENT_CLINIC_OR_DEPARTMENT_OTHER): Payer: Self-pay | Admitting: Emergency Medicine

## 2021-10-12 ENCOUNTER — Other Ambulatory Visit: Payer: Self-pay

## 2021-10-12 DIAGNOSIS — K4090 Unilateral inguinal hernia, without obstruction or gangrene, not specified as recurrent: Secondary | ICD-10-CM | POA: Diagnosis present

## 2021-10-12 DIAGNOSIS — Z760 Encounter for issue of repeat prescription: Secondary | ICD-10-CM | POA: Insufficient documentation

## 2021-10-12 DIAGNOSIS — N189 Chronic kidney disease, unspecified: Secondary | ICD-10-CM | POA: Insufficient documentation

## 2021-10-12 MED ORDER — OXYCODONE HCL 5 MG PO TABS: 5.0000 mg | ORAL_TABLET | ORAL | 0 refills | Status: DC | PRN

## 2021-10-12 NOTE — ED Provider Notes (Cosign Needed Addendum)
[facility] EMERGENCY DEPARTMENT Provider Note   CSN: [id number] Arrival date & time: 10/12/21  1510     History  Chief Complaint  Patient presents with   Medication Refill    Jermaine Gutierrez is a 61 y.o. male who presents to the emergency department requesting refill of his pain medication that he received 4 days ago.  Patient was initially evaluated in the emergency department where he was diagnosed with a left-sided inguinal hernia.  Patient has follow-up appointment with a general surgeon on 10/26/2021, but was unable to get in there earlier.  He tried calling the surgeon for refills of his pain medication, however he was told that without evaluation, they cannot accommodate this.  They referred him back to the emergency department should he need require additional refills.  Patient states that he has been supplementing with Tylenol but the pain remains fairly constant.  Pain has not worsened and he denies abdominal pain, nausea, vomiting, diarrhea.  He is unable to use NSAIDs due to his chronic kidney disease.   Medication Refill      Home Medications Prior to Admission medications   Medication Sig Start Date End Date Taking? Authorizing Provider  oxyCODONE (ROXICODONE) 5 MG immediate release tablet Take 1 tablet (5 mg total) by mouth every 4 (four) hours as needed for severe pain. 10/12/21  Yes Janell Quiet, PA-C  acetaminophen (TYLENOL) 325 MG tablet Take 2 tablets (650 mg total) by mouth every 6 (six) hours as needed for up to 30 doses for moderate pain or mild pain. 10/08/21   Terald Sleeper, MD  acetaminophen (TYLENOL) 500 MG tablet Take 1 tablet by mouth 4 (four) times daily.    [provider]  albuterol (PROVENTIL HFA;VENTOLIN HFA) 108 (90 Base) MCG/ACT inhaler Inhale 2 puffs into the lungs every 6 (six) hours as needed for wheezing or shortness of breath.    [provider]  amitriptyline (ELAVIL) 25 MG tablet Take 25 mg by mouth at  bedtime. 03/27/20   [provider]  diazepam (VALIUM) 5 MG tablet Take 5 mg by mouth daily as needed. 10/30/20   [provider]  dicyclomine (BENTYL) 20 MG tablet Take 20 mg by mouth 3 (three) times daily. 04/26/20   [provider]  diltiazem (CARDIZEM CD) 240 MG 24 hr capsule Take 1 capsule (240 mg total) by mouth daily. 10/10/20   Fenton, Clint R, PA  ELIQUIS 5 MG TABS tablet Take 1 tablet (5 mg total) by mouth 2 (two) times daily. 05/31/20   Fenton, Clint R, PA  flecainide (TAMBOCOR) 100 MG tablet TAKE 1 TABLET BY MOUTH TWICE DAILY 08/24/20   Fenton, Clint R, PA  gabapentin (NEURONTIN) 800 MG tablet Take 800 mg by mouth 4 (four) times daily. 04/08/20   [provider]  losartan (COZAAR) 25 MG tablet Take 25 mg by mouth daily. 04/05/20   [provider]  pantoprazole (PROTONIX) 40 MG tablet Take 1 tablet by mouth daily.    [provider]  senna-docusate (SENOKOT-S) 8.6-50 MG tablet Take 1 tablet by mouth daily. 10/08/21 11/07/21  Terald Sleeper, MD  VRAYLAR capsule Take 1.5 mg by mouth daily. 04/17/20   [provider]      Allergies    Azithromycin, Doxycycline, Erythromycin, Nsaids, Penicillins, Thorazine [chlorpromazine], Clindamycin/lincomycin, Doxepin, Sulfa antibiotics, Tolmetin, Tramadol, Clonopin [clonazepam], Darvocet [propoxyphene n-acetaminophen], Flurbiprofen, and Naprosyn [naproxen]    Review of Systems   Review of Systems  Genitourinary:  Negative for  penile swelling, scrotal swelling and testicular pain.    Physical Exam Updated Vital Signs BP (!) 135/103   Pulse 79   Temp 98.3 F (36.8 C) (Oral)   Resp 18   Ht 5\' 8"  (1.727 m)   Wt 98.9 kg   SpO2 99%   BMI 33.15 kg/m  Physical Exam Vitals and nursing note reviewed.  Constitutional:      General: He is not in acute distress.    Appearance: He is not ill-appearing.  HENT:     Head: Atraumatic.  Eyes:     Conjunctiva/sclera: Conjunctivae normal.   Cardiovascular:     Rate and Rhythm: Normal rate and regular rhythm.     Pulses: Normal pulses.     Heart sounds: No murmur heard. Pulmonary:     Effort: Pulmonary effort is normal. No respiratory distress.     Breath sounds: Normal breath sounds.  Abdominal:     General: Abdomen is flat. There is no distension.     Palpations: Abdomen is soft.     Tenderness: There is no abdominal tenderness.  Genitourinary:    Comments: Tenderness to left inguinal region.  No palpable deformity, no hernia noted of the scrotal sac. Musculoskeletal:        General: Normal range of motion.     Cervical back: Normal range of motion.  Skin:    General: Skin is warm and dry.     Capillary Refill: Capillary refill takes less than 2 seconds.  Neurological:     General: No focal deficit present.     Mental Status: He is alert.  Psychiatric:        Mood and Affect: Mood normal.     ED Results / Procedures / Treatments   Labs (all labs ordered are listed, but only abnormal results are displayed) Labs Reviewed - No data to display  EKG None  Radiology No results found.  Procedures Procedures    Medications Ordered in ED Medications - No data to display  ED Course/ Medical Decision Making/ A&P                           Medical Decision Making Risk Prescription drug management.   61 year old male presents emergency department requesting refill on his pain medication for left-sided inguinal hernia diagnosed 4 days ago.  Vitals are without significant abnormality.  I did consider that his hernia may have become incarcerated or strangulated, however on exam patient is well-appearing and in no acute distress.  No active vomiting, nausea.  No hernia palpable in the scrotal sac although he continues to have tenderness over the left inguinal region.  No palpable defect.  After discussion with my attending, plan to refill patient's medication, however advised that he will likely not receive  additional refills should he come back to the emergency department and unfortunately he will likely need to wait until he can be seen by his surgeon on 10/26/2021.  Patient expresses understanding and is amenable to plan.  Discharged home in good condition.  Final diagnoses:  Unilateral inguinal hernia without obstruction or gangrene, recurrence not specified  Medication refill    Rx / DC Orders ED Discharge Orders          Ordered    oxyCODONE (ROXICODONE) 5 MG immediate release tablet  Every 4 hours PRN        10/12/21 1632  Janell Quiet, PA-C 10/12/21 1638    Janell Quiet, PA-C 10/12/21 1639    Alvira Monday, MD 10/13/21 [phone]

## 2021-10-12 NOTE — Discharge Instructions (Signed)
I sent you in a refill for your pain medications for your hernia.  Please note that it is unlikely that you will be able to obtain another refill if you come back to the emergency department.  Please try to supplement with Tylenol at home until you are able to see your surgeon on the 15th.  If pain severely worsens, feel free to return to the emergency department for reevaluation.

## 2021-10-12 NOTE — ED Triage Notes (Signed)
Pt reports out of pain medication, dx with hernia on 10/08/21

## 2021-12-03 ENCOUNTER — Other Ambulatory Visit: Payer: Self-pay

## 2021-12-03 ENCOUNTER — Emergency Department (HOSPITAL_BASED_OUTPATIENT_CLINIC_OR_DEPARTMENT_OTHER)
Admission: EM | Admit: 2021-12-03 | Discharge: 2021-12-03 | Discharge status: Against Medical Advice | Payer: Medicare Other | Attending: Emergency Medicine | Admitting: Emergency Medicine

## 2021-12-03 ENCOUNTER — Encounter (HOSPITAL_BASED_OUTPATIENT_CLINIC_OR_DEPARTMENT_OTHER): Payer: Self-pay | Admitting: Emergency Medicine

## 2021-12-03 DIAGNOSIS — R109 Unspecified abdominal pain: Secondary | ICD-10-CM | POA: Insufficient documentation

## 2021-12-03 DIAGNOSIS — Z5321 Procedure and treatment not carried out due to patient leaving prior to being seen by health care provider: Secondary | ICD-10-CM | POA: Insufficient documentation

## 2021-12-03 DIAGNOSIS — R1110 Vomiting, unspecified: Secondary | ICD-10-CM | POA: Diagnosis not present

## 2021-12-03 LAB — URINALYSIS, ROUTINE W REFLEX MICROSCOPIC
Bilirubin Urine: NEGATIVE
Glucose, UA: NEGATIVE mg/dL
Hgb urine dipstick: NEGATIVE
Ketones, ur: NEGATIVE mg/dL
Leukocytes,Ua: NEGATIVE
Nitrite: NEGATIVE
Protein, ur: NEGATIVE mg/dL
Specific Gravity, Urine: >=1.03 (ref 1.005–1.030)
pH: 5 (ref 5.0–8.0)

## 2021-12-03 LAB — COMPREHENSIVE METABOLIC PANEL
ALT: 21 U/L (ref 0–44)
AST: 14 U/L — ABNORMAL LOW (ref 15–41)
Albumin: 3.9 g/dL (ref 3.5–5.0)
Alkaline Phosphatase: 66 U/L (ref 38–126)
Anion gap: 8 (ref 5–15)
BUN: 27 mg/dL — ABNORMAL HIGH (ref 6–20)
CO2: 29 mmol/L (ref 22–32)
Calcium: 9.1 mg/dL (ref 8.9–10.3)
Chloride: 104 mmol/L (ref 98–111)
Creatinine, Ser: 1.71 mg/dL — ABNORMAL HIGH (ref 0.61–1.24)
GFR, Estimated: 46 mL/min — ABNORMAL LOW (ref >60)
Glucose, Bld: 123 mg/dL — ABNORMAL HIGH (ref 70–99)
Potassium: 4.1 mmol/L (ref 3.5–5.1)
Sodium: 141 mmol/L (ref 135–145)
Total Bilirubin: 0.8 mg/dL (ref 0.3–1.2)
Total Protein: 7.2 g/dL (ref 6.5–8.1)

## 2021-12-03 LAB — CBC
HCT: 52.1 % — ABNORMAL HIGH (ref 39.0–52.0)
Hemoglobin: 17.9 g/dL — ABNORMAL HIGH (ref 13.0–17.0)
MCH: 33.4 pg (ref 26.0–34.0)
MCHC: 34.4 g/dL (ref 30.0–36.0)
MCV: 97.2 fL (ref 80.0–100.0)
Platelets: 208 10*3/uL (ref 150–400)
RBC: 5.36 MIL/uL (ref 4.22–5.81)
RDW: 13.7 % (ref 11.5–15.5)
WBC: 8.2 10*3/uL (ref 4.0–10.5)
nRBC: 0 % (ref 0.0–0.2)

## 2021-12-03 LAB — LIPASE, BLOOD: Lipase: 25 U/L (ref 11–51)

## 2021-12-03 NOTE — ED Triage Notes (Signed)
Pt states he has been having abd pain for the past 4 to 5 days  Pt states his abd is distended and he has been vomiting up  bile for the past 3 days and nights  Pt also states he has a hernia that was diagnosed here and he feels that is getting worse too

## 2022-01-02 ENCOUNTER — Ambulatory Visit: Payer: Self-pay | Admitting: General Surgery

## 2022-01-13 NOTE — Progress Notes (Addendum)
COVID Vaccine received:  []  No [x]  Yes Date of any COVID positive Test in last 90 days: none  PCP - Edyth Gunnels, [place], [address], [facility]  Cardiologist - Horton Finer, MD   Clearance in 12-18-21 Note in  [place], [place] [address]   [phone] (Work)   [phone] (Fax)   Nephrology:   [facility]   [address]   [place], [place] [address]   [phone] (Work)   [phone] (Fax)   Chest x-ray - 07-16-2018  Epic EKG -  10-31-2020 epic Stress Test - n/a ECHO - 11-26-2018  Epic Cardiac Cath - n/a  Pacemaker/ICD device     [x]  N/A Spinal Cord Stimulator:[x]  No []  Yes      (Remind patient to bring remote DOS) Other Implants:   Bowel Prep - none given per patient  History of Sleep Apnea? []  No [x]  Yes   Sleep Study Date:   CPAP used?- [x]  No []  Yes  (Instruct to bring their mask & Tubing)  Does the patient monitor blood sugar? []  No []  Yes  [x]  N/A  Blood Thinner Instructions:  Eliquis  Hold x 3 days,  Patient is aware of this.  Last Dose: Sunday   ERAS Protocol Ordered: []  No  [x]  Yes PRE-SURGERY [x]  ENSURE  []  G2   Comments: Current substance abuse (Cocaine + on 12-04-2021 per lab in [place]) Patient states that he is still using cocaine and marijuana but "he'll be clean by the time he has surgery"  Activity level: Patient can not climb a flight of stairs without difficulty; he would have CP and SOB__   Anesthesia review: hx DVT/PE in 2015, A.fib, CHF, COPD, CKD3, schizophrenia, OSA, SMOKER  Patient denies shortness of breath, fever, cough and chest pain at PAT appointment.  Patient verbalized understanding and agreement to the Pre-Surgical Instructions that were given to them at this PAT appointment. Patient was also educated of the need to review these PAT instructions again prior to his/her surgery.I reviewed the appropriate phone numbers to call if they have any and  questions or concerns.

## 2022-01-13 NOTE — Patient Instructions (Signed)
SURGICAL WAITING ROOM VISITATION Patients having surgery or a procedure may have no more than 2 support people in the waiting area - these visitors may rotate in the visitor waiting room.   Children under the age of 21 must have an adult with them who is not the patient. If the patient needs to stay at the hospital during part of their recovery, the visitor guidelines for inpatient rooms apply.  PRE-OP VISITATION  Pre-op nurse will coordinate an appropriate time for 1 support person to accompany the patient in pre-op.  This support person may not rotate.  This visitor will be contacted when the time is appropriate for the visitor to come back in the pre-op area.  Please refer to the [facility] website for the visitor guidelines for Inpatients (after your surgery is over and you are in a regular room).  You are not required to quarantine at this time prior to your surgery. However, you must do this: Hand Hygiene often Do NOT share personal items Notify your provider if you are in close contact with someone who has COVID or you develop fever 100.4 or greater, new onset of sneezing, cough, sore throat, shortness of breath or body aches.   If you received a COVID test during your pre-op visit  it is requested that you wear a mask when out in public, stay away from anyone that may not be feeling well and notify your surgeon if you develop symptoms. If you test positive for Covid or have been in contact with anyone that has tested positive in the last 10 days please notify you surgeon.       Your procedure is scheduled on: January 30, 2022  Report to [facility] Main Entrance.  Report to admitting at:  06:45   AM  +++++Call this number if you have any questions or problems the morning of surgery [phone]  Do not eat food :After Midnight the night prior to your surgery/procedure.  After Midnight you may have the following liquids until   06:00  AM DAY OF SURGERY  Clear Liquid  Diet Water Black Coffee (sugar ok, NO MILK/CREAM OR CREAMERS)  Tea (sugar ok, NO MILK/CREAM OR CREAMERS) regular and decaf                             Plain Jell-O  with no fruit (NO RED)                                           Fruit ices (not with fruit pulp, NO RED)                                     Popsicles (NO RED)                                                                  Juice: apple, WHITE grape, WHITE cranberry Sports drinks like Gatorade or Powerade (NO RED)  The day of surgery:  Drink ONE (1) Pre-Surgery Clear Ensure at   06:00   AM the morning of surgery. Drink in one sitting. Do not sip.  This drink was given to you during your hospital pre-op appointment visit. Nothing else to drink after completing the Pre-Surgery Clear Ensure  : No candy, chewing gum or throat lozenges.    FOLLOW BOWEL PREP AND ANY ADDITIONAL PRE OP INSTRUCTIONS YOU RECEIVED FROM YOUR SURGEON'S OFFICE!!!   Oral Hygiene is also important to reduce your risk of infection.        Remember - BRUSH YOUR TEETH THE MORNING OF SURGERY WITH YOUR REGULAR TOOTHPASTE  Do NOT smoke after Midnight the night before surgery.   Stop taking your ELIQUIS 3 days before your surgery. Your last dose will be on Sunday 01-26-22.  Take ONLY these medicines the morning of surgery with A SIP OF WATER: Flecainide, Trilegy Ellipta, gabapentin, Diltiazem, Valbenazine (Ingrezza). You may take Pantoprazole if needed and use your albuterol inhaler.    If You have been diagnosed with Sleep Apnea - Bring CPAP mask and tubing day of surgery. We will provide you with a CPAP machine on the day of your surgery.                   You may not have any metal on your body including  jewelry, and body piercing  Do not wear lotions, powders, cologne, or deodorant  Men may shave face and neck.  Contacts, Hearing Aids, dentures or bridgework may not be worn into surgery.    DO NOT [place]. PHARMACY WILL DISPENSE MEDICATIONS LISTED ON YOUR MEDICATION LIST TO YOU DURING YOUR ADMISSION [place]!   Patients discharged on the day of surgery will not be allowed to drive home.  Someone NEEDS to stay with you for the first 24 hours after anesthesia.  Special Instructions: Bring a copy of your healthcare power of attorney and living will documents the day of surgery, if you wish to have them scanned into your [facility] Records- EPIC  Please read over the following fact sheets you were given: IF YOU HAVE QUESTIONS ABOUT YOUR PRE-OP INSTRUCTIONS, PLEASE CALL [phone]  ([place])   [facility] - Preparing for Surgery Before surgery, you can play an important role.  Because skin is not sterile, your skin needs to be as free of germs as possible.  You can reduce the number of germs on your skin by washing with CHG (chlorahexidine gluconate) soap before surgery.  CHG is an antiseptic cleaner which kills germs and bonds with the skin to continue killing germs even after washing. Please DO NOT use if you have an allergy to CHG or antibacterial soaps.  If your skin becomes reddened/irritated stop using the CHG and inform your nurse when you arrive at Short Stay. Do not shave (including legs and underarms) for at least 48 hours prior to the first CHG shower.  You may shave your face/neck.  Please follow these instructions carefully:  1.  Shower with CHG Soap the night before surgery and the  morning of surgery.  2.  If you choose to wash your hair, wash your hair first as usual with your normal  shampoo.  3.  After you shampoo, rinse your hair and body thoroughly to remove the shampoo.  4.  Use CHG as you would any other liquid soap.  You can apply chg directly to the skin and wash.  Gently with a scrungie or clean washcloth.  5.  Apply the CHG Soap to your body ONLY FROM THE NECK DOWN.   Do not use on face/ open                            Wound or open sores. Avoid contact with eyes, ears mouth and genitals (private parts).                       Wash face,  Genitals (private parts) with your normal soap.             6.  Wash thoroughly, paying special attention to the area where your  surgery  will be performed.  7.  Thoroughly rinse your body with warm water from the neck down.  8.  DO NOT shower/wash with your normal soap after using and rinsing off the CHG Soap.            9.  Pat yourself dry with a clean towel.            10.  Wear clean pajamas.            11.  Place clean sheets on your bed the night of your first shower and do not  sleep with pets.  ON THE DAY OF SURGERY : Do not apply any lotions/deodorants the morning of surgery.  Please wear clean clothes to the hospital/surgery center.    FAILURE TO FOLLOW THESE INSTRUCTIONS MAY RESULT IN THE CANCELLATION OF YOUR SURGERY  PATIENT SIGNATURE_________________________________  NURSE SIGNATURE__________________________________  ________________________________________________________________________

## 2022-01-15 ENCOUNTER — Encounter (HOSPITAL_COMMUNITY)
Admission: RE | Admit: 2022-01-15 | Discharge: 2022-01-15 | Disposition: A | Discharge status: Home / Self Care | Payer: Medicare Other | Source: Ambulatory Visit | Attending: General Surgery | Admitting: General Surgery

## 2022-01-15 ENCOUNTER — Encounter (HOSPITAL_COMMUNITY): Payer: Self-pay

## 2022-01-15 ENCOUNTER — Other Ambulatory Visit: Payer: Self-pay

## 2022-01-15 VITALS — BP 103/87 | HR 94 | Temp 97.5°F | Resp 22 | Ht 68.0 in | Wt 209.0 lb

## 2022-01-15 DIAGNOSIS — G4733 Obstructive sleep apnea (adult) (pediatric): Secondary | ICD-10-CM | POA: Insufficient documentation

## 2022-01-15 DIAGNOSIS — I130 Hypertensive heart and chronic kidney disease with heart failure and stage 1 through stage 4 chronic kidney disease, or unspecified chronic kidney disease: Secondary | ICD-10-CM | POA: Insufficient documentation

## 2022-01-15 DIAGNOSIS — I4891 Unspecified atrial fibrillation: Secondary | ICD-10-CM | POA: Diagnosis not present

## 2022-01-15 DIAGNOSIS — J449 Chronic obstructive pulmonary disease, unspecified: Secondary | ICD-10-CM | POA: Insufficient documentation

## 2022-01-15 DIAGNOSIS — K4090 Unilateral inguinal hernia, without obstruction or gangrene, not specified as recurrent: Secondary | ICD-10-CM | POA: Insufficient documentation

## 2022-01-15 DIAGNOSIS — Z01818 Encounter for other preprocedural examination: Secondary | ICD-10-CM | POA: Diagnosis present

## 2022-01-15 DIAGNOSIS — I10 Essential (primary) hypertension: Secondary | ICD-10-CM

## 2022-01-15 LAB — BASIC METABOLIC PANEL
Anion gap: 4 — ABNORMAL LOW (ref 5–15)
BUN: 18 mg/dL (ref 6–20)
CO2: 26 mmol/L (ref 22–32)
Calcium: 9 mg/dL (ref 8.9–10.3)
Chloride: 110 mmol/L (ref 98–111)
Creatinine, Ser: 1.27 mg/dL — ABNORMAL HIGH (ref 0.61–1.24)
GFR, Estimated: >60 mL/min (ref >60)
Glucose, Bld: 106 mg/dL — ABNORMAL HIGH (ref 70–99)
Potassium: 3.7 mmol/L (ref 3.5–5.1)
Sodium: 140 mmol/L (ref 135–145)

## 2022-01-15 LAB — CBC
HCT: 48.3 % (ref 39.0–52.0)
Hemoglobin: 16.5 g/dL (ref 13.0–17.0)
MCH: 33.1 pg (ref 26.0–34.0)
MCHC: 34.2 g/dL (ref 30.0–36.0)
MCV: 96.8 fL (ref 80.0–100.0)
Platelets: 202 10*3/uL (ref 150–400)
RBC: 4.99 MIL/uL (ref 4.22–5.81)
RDW: 13.3 % (ref 11.5–15.5)
WBC: 8.8 10*3/uL (ref 4.0–10.5)
nRBC: 0 % (ref 0.0–0.2)

## 2022-01-16 NOTE — Anesthesia Preprocedure Evaluation (Addendum)
Anesthesia Evaluation  Patient identified by MRN, date of birth, ID band Patient awake    Reviewed: Allergy & Precautions, NPO status , Patient's Chart, lab work & pertinent test results, reviewed documented beta blocker date and time   Airway Mallampati: I  TM Distance: >3 FB Neck ROM: Full    Dental  (+) Edentulous Upper, Edentulous Lower   Pulmonary sleep apnea , COPD,  COPD inhaler, Current Smoker and Patient abstained from smoking.,    Pulmonary exam normal breath sounds clear to auscultation       Cardiovascular hypertension, Pt. on medications +CHF  Normal cardiovascular exam+ dysrhythmias Atrial Fibrillation  Rhythm:Regular Rate:Normal     Neuro/Psych  Headaches, PSYCHIATRIC DISORDERS Anxiety Depression Bipolar Disorder Schizophrenia    GI/Hepatic GERD  Medicated and Controlled,(+)     substance abuse  alcohol use and cocaine use, IBS Chronic pancreatitis   Endo/Other  Hyperlipidemia   Renal/GU Renal InsufficiencyRenal disease  negative genitourinary   Musculoskeletal  (+) Arthritis , Osteoarthritis,  Left inguinal hernia   Abdominal (+) + obese,   Peds  Hematology Eliquis therapy-last dose 10/15   Anesthesia Other Findings   Reproductive/Obstetrics                          Anesthesia Physical Anesthesia Plan  ASA: 3  Anesthesia Plan: General   Post-op Pain Management: Minimal or no pain anticipated, Regional block*, Precedex, Dilaudid IV and Tylenol PO (pre-op)*   Induction:   PONV Risk Score and Plan: 3 and Treatment may vary due to age or medical condition and Ondansetron  Airway Management Planned: LMA and Oral ETT  Additional Equipment: None  Intra-op Plan:   Post-operative Plan: Extubation in OR  Informed Consent: I have reviewed the patients History and Physical, chart, labs and discussed the procedure including the risks, benefits and alternatives for the  proposed anesthesia with the patient or authorized representative who has indicated his/her understanding and acceptance.       Plan Discussed with: CRNA and Anesthesiologist  Anesthesia Plan Comments: (See PAT note 01/15/2022)      Anesthesia Quick Evaluation

## 2022-01-16 NOTE — Progress Notes (Signed)
Anesthesia Chart Review   Case: [id number] Date/Time: 01/30/22 0715   Procedures:      LEFT OPEN INGUINAL HERNIA REPAIR WITH MESH (Left) - TAP BLOCK     UMBILICAL HERNIA REPAIR WITH POSSIBLE MESH   Anesthesia type: General   Pre-op diagnosis: LEFT INGUINAL HERNIA   Location: [facility] ROOM 02 / [facility]   Surgeons: Kinsinger, Arta Bruce, MD       DISCUSSION:61 y.o. smoker with h/o HTN, CKD Stage III, CHF, atrial fibrillation, COPD, OSA, left inguinal hernia scheduled for above procedure 01/30/2022 with Dr. Gurney Maxin.   Pt seen by cardiology 12/18/2021. Per OV note, "At this time, Mr. Remo is doing well from a cardiovascular standpoint. He is on a good medical regimen of atorvastatin, diltiazem, apixaban, flecainide and losartan. 2. His RCRI score is 1/6 and he is safe to proceed with bowel surgery from a cardiac standpoint."  Pt advised to hold Eliquis 3 days prior to procedure.   Anticipate pt can proceed with planned procedure barring acute status change.   VS: BP 103/87 Comment: right arm sitting  Pulse 94   Temp (!) 36.4 C (Oral)   Resp (!) 22   Ht 5\' 8"  (1.727 m)   Wt 94.8 kg   SpO2 96%   BMI 31.78 kg/m   PROVIDERS: Edyth Gunnels, DO is PCP   Horton Finer, MD is Cardiologist  LABS: Labs reviewed: Acceptable for surgery. (all labs ordered are listed, but only abnormal results are displayed)  Labs Reviewed  BASIC METABOLIC PANEL - Abnormal; Notable for the following components:      Result Value   Glucose, Bld 106 (*)    Creatinine, Ser 1.27 (*)    Anion gap 4 (*)    All other components within normal limits  CBC     IMAGES:   EKG:   CV: Echo 07/23/2021 SUMMARY  Injection of agitated saline contrast performed to evaluate for  possible shunt  The left ventricular size is normal.  Moderate left ventricular hypertrophy  Left ventricular systolic function is normal.  LV ejection fraction = 55-60%.  No segmental wall motion  abnormalities seen in the left ventricle  The right ventricle is normal in size and function.  Injection of agitated saline showed no right-to-left shunt.  There is no significant valvular stenosis or regurgitation.  The aortic sinus is normal size.  IVC size was normal.  There is no pericardial effusion.  Probably no significant change in comparison with the prior study  noted  Past Medical History:  Diagnosis Date   Acid reflux    Atrial fibrillation (HCC)    Bipolar affective disorder (HCC)    CHF (congestive heart failure) (HCC)    Chronic kidney disease (CKD), stage III (moderate) (HCC)    Chronic pain syndrome    COPD (chronic obstructive pulmonary disease) (HCC)    DDD (degenerative disc disease), lumbosacral    Depression with anxiety    Hypertension    IBS (irritable bowel syndrome)    Migraines    Obesity    Obstructive sleep apnea    severe, does not have CPAP   Pancreatitis, chronic (HCC)    Schizophrenia ([place])    Substance abuse ([place])     Past Surgical History:  Procedure Laterality Date   APPENDECTOMY     CHOLECYSTECTOMY     PROSTATE SURGERY      MEDICATIONS:  acetaminophen (TYLENOL) 500 MG tablet   albuterol (PROVENTIL HFA;VENTOLIN HFA) 108 (90 Base) MCG/ACT inhaler  atorvastatin (LIPITOR) 40 MG tablet   diltiazem (CARDIZEM CD) 240 MG 24 hr capsule   ELIQUIS 5 MG TABS tablet   flecainide (TAMBOCOR) 100 MG tablet   gabapentin (NEURONTIN) 800 MG tablet   losartan (COZAAR) 50 MG tablet   oxyCODONE (ROXICODONE) 5 MG immediate release tablet   pantoprazole (PROTONIX) 40 MG tablet   topiramate (TOPAMAX) 50 MG tablet   traMADol (ULTRAM) 50 MG tablet   TRELEGY ELLIPTA 200-62.5-25 MCG/ACT AEPB   valbenazine (INGREZZA) 60 MG capsule   VRAYLAR capsule   No current facility-administered medications for this encounter.   Konrad Felix Ward, PA-C [facility]Surgical Testing [phone]

## 2022-01-29 ENCOUNTER — Encounter (HOSPITAL_COMMUNITY): Payer: Self-pay | Admitting: General Surgery

## 2022-01-30 ENCOUNTER — Encounter (HOSPITAL_COMMUNITY)
Admission: RE | Disposition: A | Discharge status: Home / Self Care | Payer: Self-pay | Source: Ambulatory Visit | Attending: General Surgery

## 2022-01-30 ENCOUNTER — Ambulatory Visit (HOSPITAL_COMMUNITY)
Admission: RE | Admit: 2022-01-30 | Discharge: 2022-01-30 | Disposition: A | Discharge status: Home / Self Care | Payer: Medicare Other | Source: Ambulatory Visit | Attending: General Surgery | Admitting: General Surgery

## 2022-01-30 ENCOUNTER — Ambulatory Visit (HOSPITAL_BASED_OUTPATIENT_CLINIC_OR_DEPARTMENT_OTHER): Payer: Medicare Other | Admitting: Certified Registered Nurse Anesthetist

## 2022-01-30 ENCOUNTER — Other Ambulatory Visit: Payer: Self-pay

## 2022-01-30 ENCOUNTER — Ambulatory Visit (HOSPITAL_COMMUNITY): Payer: Medicare Other | Admitting: Physician Assistant

## 2022-01-30 ENCOUNTER — Encounter (HOSPITAL_COMMUNITY): Payer: Self-pay | Admitting: General Surgery

## 2022-01-30 DIAGNOSIS — I130 Hypertensive heart and chronic kidney disease with heart failure and stage 1 through stage 4 chronic kidney disease, or unspecified chronic kidney disease: Secondary | ICD-10-CM | POA: Insufficient documentation

## 2022-01-30 DIAGNOSIS — F17210 Nicotine dependence, cigarettes, uncomplicated: Secondary | ICD-10-CM

## 2022-01-30 DIAGNOSIS — I509 Heart failure, unspecified: Secondary | ICD-10-CM | POA: Insufficient documentation

## 2022-01-30 DIAGNOSIS — Z8249 Family history of ischemic heart disease and other diseases of the circulatory system: Secondary | ICD-10-CM | POA: Diagnosis not present

## 2022-01-30 DIAGNOSIS — J449 Chronic obstructive pulmonary disease, unspecified: Secondary | ICD-10-CM | POA: Diagnosis not present

## 2022-01-30 DIAGNOSIS — K4090 Unilateral inguinal hernia, without obstruction or gangrene, not specified as recurrent: Secondary | ICD-10-CM | POA: Insufficient documentation

## 2022-01-30 DIAGNOSIS — G4730 Sleep apnea, unspecified: Secondary | ICD-10-CM | POA: Insufficient documentation

## 2022-01-30 DIAGNOSIS — K589 Irritable bowel syndrome without diarrhea: Secondary | ICD-10-CM | POA: Diagnosis not present

## 2022-01-30 DIAGNOSIS — F319 Bipolar disorder, unspecified: Secondary | ICD-10-CM | POA: Insufficient documentation

## 2022-01-30 DIAGNOSIS — M1990 Unspecified osteoarthritis, unspecified site: Secondary | ICD-10-CM | POA: Diagnosis not present

## 2022-01-30 DIAGNOSIS — I110 Hypertensive heart disease with heart failure: Secondary | ICD-10-CM | POA: Diagnosis not present

## 2022-01-30 DIAGNOSIS — K219 Gastro-esophageal reflux disease without esophagitis: Secondary | ICD-10-CM | POA: Diagnosis not present

## 2022-01-30 DIAGNOSIS — F419 Anxiety disorder, unspecified: Secondary | ICD-10-CM | POA: Diagnosis not present

## 2022-01-30 DIAGNOSIS — K429 Umbilical hernia without obstruction or gangrene: Secondary | ICD-10-CM | POA: Insufficient documentation

## 2022-01-30 DIAGNOSIS — N189 Chronic kidney disease, unspecified: Secondary | ICD-10-CM | POA: Diagnosis not present

## 2022-01-30 DIAGNOSIS — K861 Other chronic pancreatitis: Secondary | ICD-10-CM | POA: Diagnosis not present

## 2022-01-30 DIAGNOSIS — I4811 Longstanding persistent atrial fibrillation: Secondary | ICD-10-CM | POA: Insufficient documentation

## 2022-01-30 HISTORY — PX: UMBILICAL HERNIA REPAIR: SHX196

## 2022-01-30 HISTORY — PX: INGUINAL HERNIA REPAIR: SHX194

## 2022-01-30 SURGERY — REPAIR, HERNIA, INGUINAL, ADULT
Anesthesia: General | Site: Inguinal

## 2022-01-30 MED ORDER — ROCURONIUM BROMIDE 100 MG/10ML IV SOLN
INTRAVENOUS | Status: DC | PRN
  Administered 2022-01-30: 70 mg via INTRAVENOUS

## 2022-01-30 MED ORDER — FENTANYL CITRATE (PF) 100 MCG/2ML IJ SOLN
INTRAMUSCULAR | Status: DC | PRN
  Administered 2022-01-30 (×4): 50 ug via INTRAVENOUS

## 2022-01-30 MED ORDER — ONDANSETRON HCL 4 MG/2ML IJ SOLN
INTRAMUSCULAR | Status: DC | PRN
  Administered 2022-01-30: 4 mg via INTRAVENOUS

## 2022-01-30 MED ORDER — PROPOFOL 10 MG/ML IV BOLUS
INTRAVENOUS | Status: AC
  Filled 2022-01-30: qty 20

## 2022-01-30 MED ORDER — HYDROMORPHONE HCL 1 MG/ML IJ SOLN
0.2500 mg | INTRAMUSCULAR | Status: DC | PRN
  Administered 2022-01-30: 0.5 mg via INTRAVENOUS

## 2022-01-30 MED ORDER — 0.9 % SODIUM CHLORIDE (POUR BTL) OPTIME
TOPICAL | Status: DC | PRN
  Administered 2022-01-30: 1000 mL

## 2022-01-30 MED ORDER — EPHEDRINE SULFATE-NACL 50-0.9 MG/10ML-% IV SOSY
PREFILLED_SYRINGE | INTRAVENOUS | Status: DC | PRN
  Administered 2022-01-30 (×3): 5 mg via INTRAVENOUS

## 2022-01-30 MED ORDER — BUPIVACAINE-EPINEPHRINE (PF) 0.25% -1:200000 IJ SOLN
INTRAMUSCULAR | Status: AC
  Filled 2022-01-30: qty 30

## 2022-01-30 MED ORDER — PHENYLEPHRINE 80 MCG/ML (10ML) SYRINGE FOR IV PUSH (FOR BLOOD PRESSURE SUPPORT)
PREFILLED_SYRINGE | INTRAVENOUS | Status: AC
  Filled 2022-01-30: qty 10

## 2022-01-30 MED ORDER — ONDANSETRON HCL 4 MG/2ML IJ SOLN: 4.0000 mg | Freq: Once | INTRAMUSCULAR | Status: DC | PRN

## 2022-01-30 MED ORDER — PROPOFOL 10 MG/ML IV BOLUS
INTRAVENOUS | Status: DC | PRN
  Administered 2022-01-30: 160 mg via INTRAVENOUS

## 2022-01-30 MED ORDER — PHENYLEPHRINE 80 MCG/ML (10ML) SYRINGE FOR IV PUSH (FOR BLOOD PRESSURE SUPPORT)
PREFILLED_SYRINGE | INTRAVENOUS | Status: DC | PRN
  Administered 2022-01-30: 120 ug via INTRAVENOUS
  Administered 2022-01-30: 80 ug via INTRAVENOUS
  Administered 2022-01-30: 160 ug via INTRAVENOUS
  Administered 2022-01-30: 120 ug via INTRAVENOUS
  Administered 2022-01-30: 160 ug via INTRAVENOUS
  Administered 2022-01-30: 80 ug via INTRAVENOUS

## 2022-01-30 MED ORDER — BUPIVACAINE-EPINEPHRINE 0.25% -1:200000 IJ SOLN
INTRAMUSCULAR | Status: DC | PRN
  Administered 2022-01-30: 30 mL

## 2022-01-30 MED ORDER — OXYCODONE HCL 5 MG/5ML PO SOLN: 5.0000 mg | Freq: Once | ORAL | Status: DC | PRN

## 2022-01-30 MED ORDER — MIDAZOLAM HCL 2 MG/2ML IJ SOLN
INTRAMUSCULAR | Status: DC | PRN
  Administered 2022-01-30: 2 mg via INTRAVENOUS

## 2022-01-30 MED ORDER — CHLORHEXIDINE GLUCONATE CLOTH 2 % EX PADS: 6.0000 | MEDICATED_PAD | Freq: Once | CUTANEOUS | Status: DC

## 2022-01-30 MED ORDER — FENTANYL CITRATE (PF) 100 MCG/2ML IJ SOLN
INTRAMUSCULAR | Status: AC
  Filled 2022-01-30: qty 2

## 2022-01-30 MED ORDER — SUGAMMADEX SODIUM 200 MG/2ML IV SOLN
INTRAVENOUS | Status: DC | PRN
  Administered 2022-01-30: 200 mg via INTRAVENOUS

## 2022-01-30 MED ORDER — BUPIVACAINE LIPOSOME 1.3 % IJ SUSP
INTRAMUSCULAR | Status: AC
  Filled 2022-01-30: qty 20

## 2022-01-30 MED ORDER — ONDANSETRON HCL 4 MG/2ML IJ SOLN
INTRAMUSCULAR | Status: AC
  Filled 2022-01-30: qty 2

## 2022-01-30 MED ORDER — MIDAZOLAM HCL 2 MG/2ML IJ SOLN
INTRAMUSCULAR | Status: AC
  Filled 2022-01-30: qty 2

## 2022-01-30 MED ORDER — ROCURONIUM BROMIDE 10 MG/ML (PF) SYRINGE
PREFILLED_SYRINGE | INTRAVENOUS | Status: AC
  Filled 2022-01-30: qty 10

## 2022-01-30 MED ORDER — OXYCODONE HCL 5 MG PO TABS: 5.0000 mg | ORAL_TABLET | Freq: Four times a day (QID) | ORAL | 0 refills | Status: AC | PRN

## 2022-01-30 MED ORDER — BUPIVACAINE HCL (PF) 0.5 % IJ SOLN
INTRAMUSCULAR | Status: AC
  Filled 2022-01-30: qty 30

## 2022-01-30 MED ORDER — LIDOCAINE HCL (PF) 2 % IJ SOLN
INTRAMUSCULAR | Status: AC
  Filled 2022-01-30: qty 5

## 2022-01-30 MED ORDER — CHLORHEXIDINE GLUCONATE 0.12 % MT SOLN
15.0000 mL | Freq: Once | OROMUCOSAL | Status: AC
  Administered 2022-01-30: 15 mL via OROMUCOSAL

## 2022-01-30 MED ORDER — DEXAMETHASONE SODIUM PHOSPHATE 10 MG/ML IJ SOLN
INTRAMUSCULAR | Status: AC
  Filled 2022-01-30: qty 1

## 2022-01-30 MED ORDER — ENSURE PRE-SURGERY PO LIQD
296.0000 mL | Freq: Once | ORAL | Status: DC
  Filled 2022-01-30: qty 296

## 2022-01-30 MED ORDER — LIDOCAINE 2% (20 MG/ML) 5 ML SYRINGE
INTRAMUSCULAR | Status: DC | PRN
  Administered 2022-01-30: 100 mg via INTRAVENOUS

## 2022-01-30 MED ORDER — OXYCODONE HCL 5 MG PO TABS: 5.0000 mg | ORAL_TABLET | Freq: Once | ORAL | Status: DC | PRN

## 2022-01-30 MED ORDER — ORAL CARE MOUTH RINSE: 15.0000 mL | Freq: Once | OROMUCOSAL | Status: AC

## 2022-01-30 MED ORDER — LACTATED RINGERS IV SOLN: INTRAVENOUS | Status: DC

## 2022-01-30 MED ORDER — HYDROMORPHONE HCL 1 MG/ML IJ SOLN
INTRAMUSCULAR | Status: AC
  Filled 2022-01-30: qty 1

## 2022-01-30 MED ORDER — DEXMEDETOMIDINE HCL IN NACL 80 MCG/20ML IV SOLN
INTRAVENOUS | Status: DC | PRN
  Administered 2022-01-30: 8 ug via BUCCAL
  Administered 2022-01-30: 4 ug via BUCCAL

## 2022-01-30 MED ORDER — VANCOMYCIN HCL IN DEXTROSE 1-5 GM/200ML-% IV SOLN
1000.0000 mg | Freq: Once | INTRAVENOUS | Status: AC
  Administered 2022-01-30: 1000 mg via INTRAVENOUS
  Filled 2022-01-30: qty 200

## 2022-01-30 SURGICAL SUPPLY — 50 items
ADH SKN CLS APL DERMABOND .7 (GAUZE/BANDAGES/DRESSINGS) ×2
APL PRP STRL LF DISP 70% ISPRP (MISCELLANEOUS) ×2
APL SKNCLS STERI-STRIP NONHPOA (GAUZE/BANDAGES/DRESSINGS)
BAG COUNTER SPONGE SURGICOUNT (BAG) ×2 IMPLANT
BAG SPNG CNTER NS LX DISP (BAG)
BENZOIN TINCTURE PRP APPL 2/3 (GAUZE/BANDAGES/DRESSINGS) IMPLANT
BINDER ABDOMINAL 12 ML 46-62 (SOFTGOODS) IMPLANT
BLADE SURG 15 STRL LF DISP TIS (BLADE) ×2 IMPLANT
BLADE SURG 15 STRL SS (BLADE) ×2
BLADE SURG SZ10 CARB STEEL (BLADE) ×2 IMPLANT
CHLORAPREP W/TINT 26 (MISCELLANEOUS) ×2 IMPLANT
COVER SURGICAL LIGHT HANDLE (MISCELLANEOUS) ×2 IMPLANT
DERMABOND ADVANCED .7 DNX12 (GAUZE/BANDAGES/DRESSINGS) ×2 IMPLANT
DRAIN PENROSE 0.5X18 (DRAIN) IMPLANT
DRAPE LAPAROSCOPIC ABDOMINAL (DRAPES) ×2 IMPLANT
DRAPE LAPAROTOMY TRNSV 102X78 (DRAPES) ×2 IMPLANT
DRAPE UTILITY 15X26 TOWEL STRL (DRAPES) ×2 IMPLANT
DRSG TELFA PLUS 4X6 ADH ISLAND (GAUZE/BANDAGES/DRESSINGS) IMPLANT
ELECT REM PT RETURN 15FT ADLT (MISCELLANEOUS) ×2 IMPLANT
GAUZE SPONGE 4X4 12PLY STRL (GAUZE/BANDAGES/DRESSINGS) IMPLANT
GLOVE BIOGEL PI IND STRL 7.0 (GLOVE) ×2 IMPLANT
GLOVE SURG SS PI 7.0 STRL IVOR (GLOVE) ×2 IMPLANT
GOWN STRL REUS W/ TWL LRG LVL3 (GOWN DISPOSABLE) ×2 IMPLANT
GOWN STRL REUS W/ TWL XL LVL3 (GOWN DISPOSABLE) IMPLANT
GOWN STRL REUS W/TWL LRG LVL3 (GOWN DISPOSABLE) ×2
GOWN STRL REUS W/TWL XL LVL3 (GOWN DISPOSABLE)
KIT BASIN OR (CUSTOM PROCEDURE TRAY) ×4 IMPLANT
KIT TURNOVER KIT A (KITS) IMPLANT
MARKER SKIN DUAL TIP RULER LAB (MISCELLANEOUS) ×2 IMPLANT
MESH HERNIA 3X6 (Mesh General) IMPLANT
NEEDLE HYPO 22GX1.5 SAFETY (NEEDLE) ×2 IMPLANT
PACK BASIC VI WITH GOWN DISP (CUSTOM PROCEDURE TRAY) ×2 IMPLANT
PENCIL SMOKE EVACUATOR (MISCELLANEOUS) ×2 IMPLANT
SPIKE FLUID TRANSFER (MISCELLANEOUS) ×2 IMPLANT
SPONGE T-LAP 4X18 ~~LOC~~+RFID (SPONGE) ×2 IMPLANT
STRIP CLOSURE SKIN 1/2X4 (GAUZE/BANDAGES/DRESSINGS) IMPLANT
SUT MNCRL AB 4-0 PS2 18 (SUTURE) ×2 IMPLANT
SUT NOVA NAB GS-21 0 18 T12 DT (SUTURE) IMPLANT
SUT PDS AB 0 CT1 36 (SUTURE) IMPLANT
SUT PDS AB 2-0 CT2 27 (SUTURE) IMPLANT
SUT PROLENE 2 0 CT2 30 (SUTURE) ×4 IMPLANT
SUT VIC AB 3-0 SH 18 (SUTURE) ×2 IMPLANT
SUT VIC AB 3-0 SH 27 (SUTURE) ×6
SUT VIC AB 3-0 SH 27X BRD (SUTURE) ×2 IMPLANT
SUT VIC AB 3-0 SH 27XBRD (SUTURE) ×4 IMPLANT
SYR BULB IRRIG 60ML STRL (SYRINGE) ×2 IMPLANT
SYR CONTROL 10ML LL (SYRINGE) ×2 IMPLANT
TOWEL OR 17X26 10 PK STRL BLUE (TOWEL DISPOSABLE) ×2 IMPLANT
TOWEL OR NON WOVEN STRL DISP B (DISPOSABLE) ×2 IMPLANT
YANKAUER SUCT BULB TIP 10FT TU (MISCELLANEOUS) ×2 IMPLANT

## 2022-01-30 NOTE — Discharge Instructions (Signed)
[facility] _______[facility]  UMBILICAL OR INGUINAL HERNIA REPAIR: POST OP INSTRUCTIONS  Always review your discharge instruction sheet given to you by the facility where your surgery was performed. IF YOU HAVE DISABILITY OR FAMILY LEAVE FORMS, YOU MUST BRING THEM TO THE OFFICE FOR PROCESSING.   DO NOT GIVE THEM TO YOUR DOCTOR.  1. A  prescription for pain medication may be given to you upon discharge.  Take your pain medication as prescribed, if needed.  If narcotic pain medicine is not needed, then you may take acetaminophen (Tylenol) or ibuprofen (Advil) as needed. 2. Take your usually prescribed medications unless otherwise directed. If you need a refill on your pain medication, please contact your pharmacy.  They will contact our office to request authorization. Prescriptions will not be filled after 5 pm or on week-ends. 3. You should follow a light diet the first 24 hours after arrival home, such as soup and crackers, etc.  Be sure to include lots of fluids daily.  Resume your normal diet the day after surgery. 4.Most patients will experience some swelling and bruising around the umbilicus or in the groin and scrotum.  Ice packs and reclining will help.  Swelling and bruising can take several days to resolve.  6. It is common to experience some constipation if taking pain medication after surgery.  Increasing fluid intake and taking a stool softener (such as Colace) will usually help or prevent this problem from occurring.  A mild laxative (Milk of Magnesia or Miralax) should be taken according to package directions if there are no bowel movements after 48 hours. 7. Unless discharge instructions indicate otherwise, you may remove your bandages 24-48 hours after surgery, and you may shower at that time.  You may have steri-strips (small skin tapes) in place directly over the incision.  These strips should be left on the skin for 7-10 days.  If your surgeon used skin glue on the  incision, you may shower in 24 hours.  The glue will flake off over the next 2-3 weeks.  Any sutures or staples will be removed at the office during your follow-up visit. 8. ACTIVITIES:  You may resume regular (light) daily activities beginning the next day--such as daily self-care, walking, climbing stairs--gradually increasing activities as tolerated.  You may have sexual intercourse when it is comfortable.  Refrain from any heavy lifting or straining until approved by your doctor.  a.You may drive when you are no longer taking prescription pain medication, you can comfortably wear a seatbelt, and you can safely maneuver your car and apply brakes. b.RETURN TO WORK:   _____________________________________________  9.You should see your doctor in the office for a follow-up appointment approximately 2-3 weeks after your surgery.  Make sure that you call for this appointment within a day or two after you arrive home to insure a convenient appointment time. 10.OTHER INSTRUCTIONS: _________________________    _____________________________________  WHEN TO CALL YOUR DOCTOR: Fever over 101.0 Inability to urinate Nausea and/or vomiting Extreme swelling or bruising Continued bleeding from incision. Increased pain, redness, or drainage from the incision  The clinic staff is available to answer your questions during regular business hours.  Please don't hesitate to call and ask to speak to one of the nurses for clinical concerns.  If you have a medical emergency, go to the nearest emergency room or call 911.  A surgeon from [facility] is always on call at the hospital   [address], [address],   [place], [place]  [address] ?  [address] [address], [place], [place]   [address] [phone] ? [phone] ? FAX [phone] Web site: [email or web address]  

## 2022-01-30 NOTE — Transfer of Care (Signed)
Immediate Anesthesia Transfer of Care Note  Patient: Jermaine Gutierrez  Procedure(s) Performed: LEFT OPEN INGUINAL HERNIA REPAIR WITH MESH (Left: Inguinal) PRIMARY UMBILICAL HERNIA REPAIR (Abdomen)  Patient Location: PACU  Anesthesia Type:GA combined with regional for post-op pain  Level of Consciousness: awake, alert , oriented and patient cooperative  Airway & Oxygen Therapy: Patient Spontanous Breathing and Patient connected to face mask  Post-op Assessment: Report given to RN and Post -op Vital signs reviewed and stable  Post vital signs: Reviewed and stable  Last Vitals:  Vitals Value Taken Time  BP    Temp    Pulse    Resp    SpO2      Last Pain:  Vitals:   01/30/22 0635  TempSrc:   PainSc: 5       Patients Stated Pain Goal: 4 (99/35/70 1779)  Complications: No notable events documented.

## 2022-01-30 NOTE — Op Note (Signed)
Preop diagnosis: left inguinal hernia, umbilical hernia  Postop diagnosis: left direct inguinal hernia, umbilical hernia  Procedure: open Left inguinal hernia repair with mesh. 1 cm umbilical hernia repair  Surgeon: Gurney Maxin, M.D.  Asst: none  Anesthesia: Gen.   Indications for procedure: Jermaine Gutierrez is a 61 y.o. male with symptoms of pain and enlarging Left inguinal hernia and umbilical hernia. After discussing risks, alternatives and benefits he decided on open repairs and was brought to day surgery for repair.  Description of procedure: The patient was brought into the operative suite, placed supine. Anesthesia was administered with endotracheal tube. Patient was strapped in place. The patient was prepped and draped in the usual sterile fashion.  The anterior superior iliac spine and pubic tubercle were identified on the Left side. An incision was made 1cm above the connecting line, representative of the location of the inguinal ligament. The subcutaneous tissue was bluntly dissected, scarpa's fascia was dissected away. The external abdominal oblique fascia was identified and sharply opened down to the external inguinal ring. The conjoint tendon and inguinal ligament were identified. The cord structures and sac were dissected free of the surrounding tissue in 360 degrees. A penrose drain was used to encircle the contents. The cremasteric fibers were dissected free of the contents of the cord and hernia sac. The cord structures (vessels and vas deferens) were identified and carefully dissected away from the hernia sac. The hernia sac was reduced and contained no visceral structures.The hernia sac was dissected down to the internal inguinal ring. Preperitoneal fat was identified showing appropriate dissection. The sac was then reduced into the preperitoneal space. The hernia was direct. The floor was closed with 2-0 PDS suturing the conjoint tendon to the inguinal ligament with  interrupted sutures. A 3x6 Bard mesh was then used to close the defect and reinforce the floor. The mesh was sutured to the lacunar ligament and inguinal ligament using a 2-0 prolene in running fashion. Next the superior edge of the mesh was sutured to the conjoined tendon using a 2-0 running Prolene. An additional 2-0 Prolene was used to suture the tail ends of the mesh together re-creating the deep ring. Cord structures are running in a neutral position through the mesh. Next the external abdominal oblique fascia was closed with a 2-0 Vicryl in interrupted fashion to re-create the external inguinal ring. Scarpa's fascia was closed with 3-0 Vicryl in running fashion. Skin was closed with a 4-0 Monocryl subcuticular stitch in running fashion.   Next, attention was turned to the umbilical area. An infraumbilical incision was made. Cautery was used to dissect through the subcutaneous tissue. The umbilical skin was separated from the hernia. The hernia was 1 cm in diameter. The hernia sac was freed from the fascia and reduced into the abdomen. Interrupted 2-0 PDS was used to close the fascia. A 3-0 vicryl was used to appose the umbilical skin to the fascia. 4-0 monocryl subcuticular stitch in running fashion. Dermabond place for dressing. Patient woke from anesthesia and brought to PACU in stable condition. All counts are correct.  Findings: left direct inguinal hernia, 1 cm umbilical hernia  Specimen: none  Blood loss: 20 ml  Local anesthesia: 30 ml Marcaine with Epinephrine  Complications: none  Implant: 3 x 6 bard mesh  Gurney Maxin, M.D. General, Bariatric, & Minimally Invasive Surgery [facility], [place] 8:56 AM 01/30/2022

## 2022-01-30 NOTE — Anesthesia Procedure Notes (Signed)
Procedure Name: Intubation Date/Time: 01/30/2022 7:26 AM  Performed by: Claudia Desanctis, CRNAPre-anesthesia Checklist: Patient identified, Emergency Drugs available, Suction available and Patient being monitored Patient Re-evaluated:Patient Re-evaluated prior to induction Oxygen Delivery Method: Circle system utilized Preoxygenation: Pre-oxygenation with 100% oxygen Induction Type: IV induction Ventilation: Mask ventilation without difficulty Laryngoscope Size: Miller and 3 Grade View: Grade I Tube type: Oral Tube size: 8.0 mm Number of attempts: 1 Airway Equipment and Method: Stylet Placement Confirmation: ETT inserted through vocal cords under direct vision, positive ETCO2 and breath sounds checked- equal and bilateral Secured at: 22 cm Tube secured with: Tape Dental Injury: Teeth and Oropharynx as per pre-operative assessment

## 2022-01-30 NOTE — H&P (Signed)
Subjective   Chief Complaint: Left Inguinal Hernia   History of Present Illness: Jermaine Gutierrez is a 61 y.o. male who is seen today as an office consultation at the request of Dr. Renaye Rakers for evaluation of Left Inguinal Hernia .   He first noticed the hernia 1 month ago. Symptoms are pain in the left groin. He has a history of right testicle removal 4 years ago due to swelling. He has some shooting pain into the scrotum. He has some nausea and constipation. Pain is constant and worse with movement.   He does smoke He does not have diabetes He has no history of hernias  Review of Systems: A complete review of systems was obtained from the patient. I have reviewed this information and discussed as appropriate with the patient. See HPI as well for other ROS.  Review of Systems  Constitutional: Negative.  HENT: Negative.  Eyes: Negative.  Respiratory: Negative.  Cardiovascular: Negative.  Gastrointestinal: Positive for abdominal pain and nausea.  Genitourinary: Negative.  Musculoskeletal: Negative.  Skin: Negative.  Neurological: Negative.  Endo/Heme/Allergies: Negative.  Psychiatric/Behavioral: Negative.   Medical History: Past Medical History:  Diagnosis Date  Anxiety  Arthritis  CHF (congestive heart failure) (CMS-HCC)  Chronic kidney disease  COPD (chronic obstructive pulmonary disease) (CMS-HCC)  DVT (deep venous thrombosis) (CMS-HCC)  Glaucoma (increased eye pressure)  Hypertension  Sleep apnea   There is no problem list on file for this patient.  Past Surgical History:  Procedure Laterality Date  APPENDECTOMY  CHOLECYSTECTOMY   Allergies  Allergen Reactions  Chlorpromazine Anaphylaxis, Other (See Comments) and Shortness Of Breath  Doxycycline Itching and Rash  Erythromycin Lactobionate Shortness Of Breath  Other reaction(s): Chest Pain  Penicillins Anaphylaxis, Other (See Comments) and Shortness Of Breath  Did it involve swelling of the  face/tongue/throat, SOB, or low BP? Yes Did it involve sudden or severe rash/hives, skin peeling, or any reaction on the inside of your mouth or nose? Unknown Did you need to seek medical attention at a hospital or doctor's office? Unknown When did it last happen? 60's (age)  If all above answers are "NO", may proceed with cephalosporin use.  Other reaction(s): Chest Pain Other reaction(s): SHORTNESS OF BREATH  Tolerated Cefepime  Tolerated Cefepime  Propoxyphene N-Acetaminophen Anaphylaxis and Other (See Comments)  Chest pain  Chest pain Chest pain Chest pain  Doxepin Other (See Comments)  Caused tremors  Trimmers  Lincomycin Rash  Nsaids (Non-Steroidal Anti-Inflammatory Drug) Other (See Comments)  Causes kidney "problems" Kidney issues  Sulfa (Sulfonamide Antibiotics) Rash  Tolmetin Other (See Comments)  Causes kidney "problems"  Tramadol Other (See Comments)  mixes with other medications   mixes with other medications  Clonazepam Other (See Comments)  hallucinations  Flurbiprofen Other (See Comments)  Naproxen Other (See Comments) and Rash  Leg pain   Current Outpatient Medications on File Prior to Visit  Medication Sig Dispense Refill  acetaminophen (TYLENOL) 325 MG tablet Take by mouth  amitriptyline (ELAVIL) 25 MG tablet Take 25 mg by mouth at bedtime  diazePAM (VALIUM) 5 MG tablet TAKE 1 TABLET BY MOUTH DAILY AS NEEDED FOR SEVERE ANXIETY  dilTIAZem (TIAZAC) 240 MG ER capsule Take 1 capsule by mouth once daily  ELIQUIS 5 mg tablet Take by mouth  flecainide (TAMBOCOR) 100 MG tablet Take by mouth  losartan (COZAAR) 25 MG tablet Take 25 mg by mouth once daily  pantoprazole (PROTONIX) 40 MG DR tablet Take 40 mg by mouth once daily  sennosides-docusate (SENOKOT-S) 8.6-50 mg  tablet Take 1 tablet by mouth once daily  albuterol 90 mcg/actuation inhaler Inhale into the lungs  gabapentin (NEURONTIN) 800 MG tablet Take 800 mg by mouth 4 (four) times daily  oxyCODONE  (ROXICODONE) 5 MG immediate release tablet Take 5 mg by mouth 3 (three) times daily   No current facility-administered medications on file prior to visit.   Family History  Problem Relation Age of Onset  Obesity Mother  High blood pressure (Hypertension) Mother  Coronary Artery Disease (Blocked arteries around heart) Father  High blood pressure (Hypertension) Father  Obesity Father  Skin cancer Sister  High blood pressure (Hypertension) Brother  Obesity Brother   Social History   Tobacco Use  Smoking Status Every Day  Types: Cigarettes  Smokeless Tobacco Never   Social History   Socioeconomic History  Marital status: Single  Tobacco Use  Smoking status: Every Day  Types: Cigarettes  Smokeless tobacco: Never  Substance and Sexual Activity  Alcohol use: Yes  Drug use: Never   Objective:   Vitals:  10/25/21 1406  BP: 114/68  Pulse: 88  Temp: 36.8 C (98.2 F)  SpO2: 98%  Weight: 97.3 kg (214 lb 9.6 oz)  Height: 172.7 cm (5\' 8" )   Body mass index is 32.63 kg/m.  Physical Exam Constitutional:  Appearance: Normal appearance.  HENT:  Head: Normocephalic and atraumatic.  Pulmonary:  Effort: Pulmonary effort is normal.  Abdominal:  Comments: Small left inguinal hernia  Musculoskeletal:  General: Normal range of motion.  Cervical back: Normal range of motion.  Neurological:  General: No focal deficit present.  Mental Status: He is alert and oriented to person, place, and time. Mental status is at baseline.  Psychiatric:  Mood and Affect: Mood normal.  Behavior: Behavior normal.  Thought Content: Thought content normal.    Labs, Imaging and Diagnostic Testing: I reviewed CT scan images and notes from [facility] and [employer]  Assessment and Plan:  Diagnoses and all orders for this visit:  Non-recurrent unilateral inguinal hernia without obstruction or gangrene  Longstanding persistent atrial fibrillation (CMS-HCC)    We discussed etiology  of hernias and how they can cause pain. We discussed options for inguinal hernia repair vs observation. We discussed details of the surgery of general anesthesia, surgical approach and incisions, dissecting the sack away from vas deference, testicular vessels and nerves and placement of mesh. We discussed risks of bleeding, infection, recurrence, injury to vas deference, testicular vessels, nerve injury, and chronic pain. He showed good understanding and wanted proceed with open right inguinal hernia repair as outpatient.   Due to history of A fib on eliquis, we will plan to coordinate eliquis hold with cardiology

## 2022-01-30 NOTE — Anesthesia Postprocedure Evaluation (Signed)
Anesthesia Post Note  Patient: Michaell Grider Breth  Procedure(s) Performed: LEFT OPEN INGUINAL HERNIA REPAIR WITH MESH (Left: Inguinal) PRIMARY UMBILICAL HERNIA REPAIR (Abdomen)     Patient location during evaluation: PACU Anesthesia Type: General Level of consciousness: awake and alert and oriented Pain management: pain level controlled Vital Signs Assessment: post-procedure vital signs reviewed and stable Respiratory status: spontaneous breathing, nonlabored ventilation and respiratory function stable Cardiovascular status: blood pressure returned to baseline and stable Postop Assessment: no apparent nausea or vomiting Anesthetic complications: no   No notable events documented.  Last Vitals:  Vitals:   01/30/22 0945 01/30/22 0955  BP: (!) 152/110   Pulse: 79 77  Resp: 17   Temp: 36.7 C   SpO2: 99% 94%    Last Pain:  Vitals:   01/30/22 0949  TempSrc:   PainSc: 4                  Mahati Vajda A.

## 2022-01-31 ENCOUNTER — Encounter (HOSPITAL_COMMUNITY): Payer: Self-pay | Admitting: General Surgery

## 2022-05-19 ENCOUNTER — Emergency Department (HOSPITAL_BASED_OUTPATIENT_CLINIC_OR_DEPARTMENT_OTHER)
Admission: EM | Admit: 2022-05-19 | Discharge: 2022-05-20 | Disposition: A | Discharge status: Home / Self Care | Payer: 59 | Attending: Emergency Medicine | Admitting: Emergency Medicine

## 2022-05-19 ENCOUNTER — Other Ambulatory Visit: Payer: Self-pay

## 2022-05-19 ENCOUNTER — Encounter (HOSPITAL_BASED_OUTPATIENT_CLINIC_OR_DEPARTMENT_OTHER): Payer: Self-pay | Admitting: Emergency Medicine

## 2022-05-19 ENCOUNTER — Emergency Department (HOSPITAL_BASED_OUTPATIENT_CLINIC_OR_DEPARTMENT_OTHER): Payer: 59

## 2022-05-19 DIAGNOSIS — K5909 Other constipation: Secondary | ICD-10-CM | POA: Diagnosis not present

## 2022-05-19 DIAGNOSIS — Z7901 Long term (current) use of anticoagulants: Secondary | ICD-10-CM | POA: Diagnosis not present

## 2022-05-19 DIAGNOSIS — R1084 Generalized abdominal pain: Secondary | ICD-10-CM

## 2022-05-19 LAB — CBC WITH DIFFERENTIAL/PLATELET
Abs Immature Granulocytes: 0.03 10*3/uL (ref 0.00–0.07)
Basophils Absolute: 0 10*3/uL (ref 0.0–0.1)
Basophils Relative: 0 %
Eosinophils Absolute: 0.1 10*3/uL (ref 0.0–0.5)
Eosinophils Relative: 1 %
HCT: 46.5 % (ref 39.0–52.0)
Hemoglobin: 15.9 g/dL (ref 13.0–17.0)
Immature Granulocytes: 0 %
Lymphocytes Relative: 23 %
Lymphs Abs: 2.2 10*3/uL (ref 0.7–4.0)
MCH: 32.6 pg (ref 26.0–34.0)
MCHC: 34.2 g/dL (ref 30.0–36.0)
MCV: 95.3 fL (ref 80.0–100.0)
Monocytes Absolute: 0.6 10*3/uL (ref 0.1–1.0)
Monocytes Relative: 6 %
Neutro Abs: 6.5 10*3/uL (ref 1.7–7.7)
Neutrophils Relative %: 70 %
Platelets: 207 10*3/uL (ref 150–400)
RBC: 4.88 MIL/uL (ref 4.22–5.81)
RDW: 12.8 % (ref 11.5–15.5)
WBC: 9.5 10*3/uL (ref 4.0–10.5)
nRBC: 0 % (ref 0.0–0.2)

## 2022-05-19 LAB — URINALYSIS, ROUTINE W REFLEX MICROSCOPIC
Bilirubin Urine: NEGATIVE
Glucose, UA: NEGATIVE mg/dL
Hgb urine dipstick: NEGATIVE
Ketones, ur: NEGATIVE mg/dL
Leukocytes,Ua: NEGATIVE
Nitrite: NEGATIVE
Protein, ur: NEGATIVE mg/dL
Specific Gravity, Urine: 1.015 (ref 1.005–1.030)
pH: 7 (ref 5.0–8.0)

## 2022-05-19 LAB — COMPREHENSIVE METABOLIC PANEL
ALT: 21 U/L (ref 0–44)
AST: 18 U/L (ref 15–41)
Albumin: 4.1 g/dL (ref 3.5–5.0)
Alkaline Phosphatase: 60 U/L (ref 38–126)
Anion gap: 4 — ABNORMAL LOW (ref 5–15)
BUN: 30 mg/dL — ABNORMAL HIGH (ref 6–20)
CO2: 25 mmol/L (ref 22–32)
Calcium: 8.9 mg/dL (ref 8.9–10.3)
Chloride: 107 mmol/L (ref 98–111)
Creatinine, Ser: 1.49 mg/dL — ABNORMAL HIGH (ref 0.61–1.24)
GFR, Estimated: 54 mL/min — ABNORMAL LOW (ref >60)
Glucose, Bld: 103 mg/dL — ABNORMAL HIGH (ref 70–99)
Potassium: 4.1 mmol/L (ref 3.5–5.1)
Sodium: 136 mmol/L (ref 135–145)
Total Bilirubin: 0.7 mg/dL (ref 0.3–1.2)
Total Protein: 6.8 g/dL (ref 6.5–8.1)

## 2022-05-19 MED ORDER — LACTATED RINGERS IV BOLUS
1000.0000 mL | Freq: Once | INTRAVENOUS | Status: AC
  Administered 2022-05-19: 1000 mL via INTRAVENOUS

## 2022-05-19 MED ORDER — HYDROMORPHONE HCL 1 MG/ML IJ SOLN
0.5000 mg | Freq: Once | INTRAMUSCULAR | Status: AC
  Administered 2022-05-19: 0.5 mg via INTRAVENOUS
  Filled 2022-05-19: qty 1

## 2022-05-19 MED ORDER — ACETAMINOPHEN 500 MG PO TABS
1000.0000 mg | ORAL_TABLET | Freq: Once | ORAL | Status: AC
  Administered 2022-05-19: 1000 mg via ORAL
  Filled 2022-05-19: qty 2

## 2022-05-19 MED ORDER — IOHEXOL 300 MG/ML  SOLN
100.0000 mL | Freq: Once | INTRAMUSCULAR | Status: AC | PRN
  Administered 2022-05-19: 100 mL via INTRAVENOUS

## 2022-05-19 NOTE — ED Triage Notes (Signed)
Pt reports belly pain starting 10 days ago. Called PCP who was referred to surgeon who did hernia repair. Appt Wednesday with them. PT expresses concern about blockage. Endorses constipation.

## 2022-05-19 NOTE — Discharge Instructions (Signed)
It was our pleasure to provide your ER care today - we hope that you feel better.  Drink plenty of fluids/stay well hydrated.    Take acetaminophen as need for pain.  If constipation, make sure to stay hydrated, get adequate fiber in diet, take colace (stool softener) 2x/day, and miralax (laxative) once per day as need.   Follow up closely with primary care doctor in one week.  Return to ER if worse, new symptoms, fevers, new or worsening or severe abdominal pain, persistent vomiting, or other concern.   You were given pain meds in the ER - no driving for the next 8 hours.

## 2022-05-19 NOTE — ED Notes (Signed)
Pt given PO fluids and snacks, tolerating well

## 2022-05-19 NOTE — ED Provider Notes (Signed)
EMERGENCY DEPARTMENT AT [facility] Provider Note   CSN: [id number] Arrival date & time: 05/19/22  1953     History  Chief Complaint  Patient presents with   Abdominal Pain    Jermaine Gutierrez is a 62 y.o. male.  Pt c/o abd pain for past 1-2 weeks. Symptoms acute onset, moderate, persistent, generalized and esp left. Hx inguinal hernia repair ~4 months ago. Some recent constipation and indicates last bm was 2 days ago. No fever or chills. No dysuria, hematuria or gu c/o. No scrotal or testicular pain. No back/flank pain.  No vomiting. Some nausea.   The history is provided by the patient and medical records.  Abdominal Pain Associated symptoms: constipation   Associated symptoms: no chest pain, no diarrhea, no dysuria, no fever, no shortness of breath, no sore throat and no vomiting        Home Medications Prior to Admission medications   Medication Sig Start Date End Date Taking? Authorizing Provider  acetaminophen (TYLENOL) 500 MG tablet Take 500-1,000 mg by mouth every 6 (six) hours as needed (pain.).    [provider]  albuterol (PROVENTIL HFA;VENTOLIN HFA) 108 (90 Base) MCG/ACT inhaler Inhale 2 puffs into the lungs every 6 (six) hours as needed for wheezing or shortness of breath.    [provider]  atorvastatin (LIPITOR) 40 MG tablet Take 40 mg by mouth daily at 6 PM.    [provider]  diltiazem (CARDIZEM CD) 240 MG 24 hr capsule Take 1 capsule (240 mg total) by mouth daily. 10/10/20   Fenton, Clint R, PA  ELIQUIS 5 MG TABS tablet Take 1 tablet (5 mg total) by mouth 2 (two) times daily. 05/31/20   Fenton, Clint R, PA  flecainide (TAMBOCOR) 100 MG tablet TAKE 1 TABLET BY MOUTH TWICE DAILY 08/24/20   Fenton, Clint R, PA  gabapentin (NEURONTIN) 800 MG tablet Take 800 mg by mouth 4 (four) times daily. 04/08/20   [provider]  losartan (COZAAR) 50 MG tablet Take 50 mg by mouth daily. 04/05/20   [provider]  oxyCODONE (OXY IR/ROXICODONE) 5 MG immediate release tablet Take 1 tablet (5 mg total) by mouth every 6 (six) hours as needed for severe pain. 01/30/22   Kinsinger, Arta Bruce, MD  pantoprazole (PROTONIX) 40 MG tablet Take 40 mg by mouth daily as needed (acid reflux/indigestion.).    [provider]  topiramate (TOPAMAX) 50 MG tablet Take 50 mg by mouth 2 (two) times daily as needed for migraine. 12/19/21   [provider]  Donnal Debar 200-62.5-25 MCG/ACT AEPB Inhale 1 puff into the lungs daily. 12/17/21   [provider]  valbenazine (INGREZZA) 60 MG capsule Take 60 mg by mouth in the morning.    [provider]  VRAYLAR capsule Take 1.5 mg by mouth at bedtime. 04/17/20   [provider]      Allergies    Azithromycin, Doxycycline, Erythromycin, Nsaids, Penicillins, Thorazine [chlorpromazine], Clindamycin/lincomycin, Doxepin, Sulfa antibiotics, Tolmetin, Tramadol, Clonopin [clonazepam], Darvocet [propoxyphene n-acetaminophen], Flurbiprofen, and Naprosyn [naproxen]    Review of Systems   Review of Systems  Constitutional:  Negative for fever.  HENT:  Negative for sore throat.   Eyes:  Negative for redness.  Respiratory:  Negative for shortness of breath.   Cardiovascular:  Negative for chest pain.  Gastrointestinal:  Positive for abdominal pain and constipation. Negative for diarrhea and vomiting.  Genitourinary:  Negative for dysuria and flank pain.  Musculoskeletal:  Negative for back pain and neck pain.  Skin:  Negative for rash.  Neurological:  Negative for headaches.  Hematological:  Does not bruise/bleed easily.  Psychiatric/Behavioral:  Negative for confusion.     Physical Exam Updated Vital Signs BP 124/89 (BP Location: Right Arm)   Pulse 85   Temp 97.6 F (36.4 C) (Oral)   Resp 20   SpO2 92%  Physical Exam Vitals and nursing note reviewed.  Constitutional:      Appearance: Normal appearance. He is  well-developed.  HENT:     Head: Atraumatic.     Nose: Nose normal.     Mouth/Throat:     Mouth: Mucous membranes are moist.     Pharynx: Oropharynx is clear.  Eyes:     General: No scleral icterus.    Conjunctiva/sclera: Conjunctivae normal.  Neck:     Trachea: No tracheal deviation.  Cardiovascular:     Rate and Rhythm: Normal rate and regular rhythm.     Pulses: Normal pulses.     Heart sounds: Normal heart sounds. No murmur heard.    No friction rub. No gallop.  Pulmonary:     Effort: Pulmonary effort is normal. No accessory muscle usage or respiratory distress.     Breath sounds: Normal breath sounds.  Abdominal:     General: Bowel sounds are normal. There is no distension.     Palpations: Abdomen is soft. There is no mass.     Tenderness: There is no abdominal tenderness. There is no guarding.     Hernia: No hernia is present.  Genitourinary:    Comments: No cva tenderness. Single testicle (remote surgery) - no scrotal or testicular pain, swelling, redness, or tenderness.  Musculoskeletal:        General: No swelling.     Cervical back: Normal range of motion and neck supple. No rigidity.  Skin:    General: Skin is warm and dry.     Findings: No rash.  Neurological:     Mental Status: He is alert.     Comments: Alert, speech clear.   Psychiatric:        Mood and Affect: Mood normal.     ED Results / Procedures / Treatments   Labs (all labs ordered are listed, but only abnormal results are displayed) Results for orders placed or performed during the hospital encounter of 05/19/22  Comprehensive metabolic panel  Result Value Ref Range   Sodium 136 135 - 145 mmol/L   Potassium 4.1 3.5 - 5.1 mmol/L   Chloride 107 98 - 111 mmol/L   CO2 25 22 - 32 mmol/L   Glucose, Bld 103 (H) 70 - 99 mg/dL   BUN 30 (H) 6 - 20 mg/dL   Creatinine, Ser 6.96 (H) 0.61 - 1.24 mg/dL   Calcium 8.9 8.9 - 78.9 mg/dL   Total Protein 6.8 6.5 - 8.1 g/dL   Albumin 4.1 3.5 - 5.0 g/dL   AST  18 15 - 41 U/L   ALT 21 0 - 44 U/L   Alkaline Phosphatase 60 38 - 126 U/L   Total Bilirubin 0.7 0.3 - 1.2 mg/dL   GFR, Estimated 54 (L) >60 mL/min   Anion gap 4 (L) 5 - 15  CBC with Differential  Result Value Ref Range   WBC 9.5 4.0 - 10.5 K/uL   RBC 4.88 4.22 - 5.81 MIL/uL   Hemoglobin 15.9 13.0 - 17.0 g/dL   HCT 38.1 01.7 - 51.0 %   MCV 95.3  80.0 - 100.0 fL   MCH 32.6 26.0 - 34.0 pg   MCHC 34.2 30.0 - 36.0 g/dL   RDW 12.8 11.5 - 15.5 %   Platelets 207 150 - 400 K/uL   nRBC 0.0 0.0 - 0.2 %   Neutrophils Relative % 70 %   Neutro Abs 6.5 1.7 - 7.7 K/uL   Lymphocytes Relative 23 %   Lymphs Abs 2.2 0.7 - 4.0 K/uL   Monocytes Relative 6 %   Monocytes Absolute 0.6 0.1 - 1.0 K/uL   Eosinophils Relative 1 %   Eosinophils Absolute 0.1 0.0 - 0.5 K/uL   Basophils Relative 0 %   Basophils Absolute 0.0 0.0 - 0.1 K/uL   Immature Granulocytes 0 %   Abs Immature Granulocytes 0.03 0.00 - 0.07 K/uL  Urinalysis, Routine w reflex microscopic -Urine, Clean Catch  Result Value Ref Range   Color, Urine YELLOW YELLOW   APPearance CLEAR CLEAR   Specific Gravity, Urine 1.015 1.005 - 1.030   pH 7.0 5.0 - 8.0   Glucose, UA NEGATIVE NEGATIVE mg/dL   Hgb urine dipstick NEGATIVE NEGATIVE   Bilirubin Urine NEGATIVE NEGATIVE   Ketones, ur NEGATIVE NEGATIVE mg/dL   Protein, ur NEGATIVE NEGATIVE mg/dL   Nitrite NEGATIVE NEGATIVE   Leukocytes,Ua NEGATIVE NEGATIVE      EKG None  Radiology CT Abdomen Pelvis W Contrast  Result Date: 05/19/2022 CLINICAL DATA:  Acute abdominal pain. EXAM: CT ABDOMEN AND PELVIS WITH CONTRAST TECHNIQUE: Multidetector CT imaging of the abdomen and pelvis was performed using the standard protocol following bolus administration of intravenous contrast. RADIATION DOSE REDUCTION: This exam was performed according to the departmental dose-optimization program which includes automated exposure control, adjustment of the mA and/or kV according to patient size and/or use of  iterative reconstruction technique. CONTRAST:  145mL OMNIPAQUE IOHEXOL 300 MG/ML  SOLN COMPARISON:  Most recent CT 12/04/2021 at [place]: Lower chest: No acute basilar airspace disease or pleural effusion. Hepatobiliary: Borderline hepatic steatosis, no focal liver abnormalities. Clips in the gallbladder fossa postcholecystectomy. No biliary dilatation. Pancreas: No ductal dilatation or inflammation. Spleen: Normal in size without focal abnormality. Adrenals/Urinary Tract: Normal adrenal glands. No hydronephrosis or perinephric edema. Homogeneous renal enhancement with symmetric excretion on delayed phase imaging. No renal calculi. Small cyst in the lower left kidney, needs no further follow-up. Urinary bladder is physiologically distended without wall thickening. 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 Stomach/Bowel: No bowel obstruction or inflammation. Ingested material within the stomach. Mild colonic redundancy. Small colonic stool burden. Appendix is not visualized. Vascular/Lymphatic: Normal caliber abdominal aorta. Patent portal vein. No acute vascular findings no abdominopelvic adenopathy. Reproductive: Prostatectomy. Other: No ascites. No free air or focal fluid collection. Prior left inguinal hernia and umbilical hernia repair. Musculoskeletal: There are no acute or suspicious osseous abnormalities. IMPRESSION: No acute abnormality or explanation for abdominal pain. Electronically Signed   By: Keith Rake M.D.   On: 05/19/2022 21:47    Procedures Procedures    Medications Ordered in ED Medications  HYDROmorphone (DILAUDID) injection 0.5 mg (0.5 mg Intravenous Given 05/19/22 2104)  lactated ringers bolus 1,000 mL (0 mLs Intravenous Stopped 05/19/22 2222)  iohexol (OMNIPAQUE) 300 MG/ML solution 100 mL (100 mLs Intravenous Contrast Given 05/19/22 2117)  lactated ringers bolus 1,000 mL (1,000 mLs Intravenous New Bag/Given 05/19/22 2226)  HYDROmorphone (DILAUDID) injection  0.5 mg (0.5 mg Intravenous Given 05/19/22 2226)  acetaminophen (TYLENOL) tablet 1,000 mg (1,000 mg Oral Given 05/19/22 2226)    ED  Course/ Medical Decision Making/ A&P                             Medical Decision Making Problems Addressed: Generalized abdominal pain: acute illness or injury with systemic symptoms that poses a threat to life or bodily functions Other constipation: chronic illness or injury  Amount and/or Complexity of Data Reviewed Independent Historian: EMS    Details: hx External Data Reviewed: notes. Labs: ordered. Decision-making details documented in ED Course. Radiology: ordered and independent interpretation performed. Decision-making details documented in ED Course. ECG/medicine tests: ordered and independent interpretation performed. Decision-making details documented in ED Course.  Risk OTC drugs. Prescription drug management. Parenteral controlled substances. Decision regarding hospitalization.   Iv ns. Continuous pulse ox and cardiac monitoring. Labs ordered/sent. Imaging ordered.   Differential diagnosis includes diverticulitis, sbo, etc . Dispo decision including potential need for admission considered - will get labs and imaging and reassess.   Reviewed nursing notes and prior charts for additional history. External reports reviewed.   Cardiac monitor: sinus rhythm, rate 80.  Labs reviewed/interpreted by me - wbc normal. Ua neg.   Dilaudid iv. Ivf bolus.   CT reviewed/interpreted by me - no sbo, no diverticulitis.   Additional fluids.  Recheck pt comfortable, no distress. No nv.   Pt currently appears stable for d/c.  Rec pcp f/u.  Return precautions provided.             Final Clinical Impression(s) / ED Diagnoses Final diagnoses:  Generalized abdominal pain  Other constipation    Rx / DC Orders ED Discharge Orders     None         Lajean Saver, MD 05/19/22 2236

## 2022-08-04 ENCOUNTER — Telehealth: Payer: Self-pay | Admitting: Gastroenterology

## 2022-08-04 NOTE — Telephone Encounter (Signed)
Good morning Dr. Tomasa Rand,   Supervising Provider 4/22 AM.   We received a call from this patient wishing to schedule an appointment. Patient does have gi Hx at [facility] in 08/2021. He states he is not satisfied with the care received there and would like a second opinion on his symptoms of abdominal pain. Records are in Care Everywhere for your review. Please advise on scheduling.    Thank you.
